# Patient Record
Sex: Female | Born: 1980 | Race: White | Hispanic: No | Marital: Married | Smoking: Never smoker
Health system: Southern US, Community
[De-identification: ages and names within clinical notes are randomized; demographics above are authoritative.]

## PROBLEM LIST (undated history)

## (undated) DIAGNOSIS — I499 Cardiac arrhythmia, unspecified: Secondary | ICD-10-CM

## (undated) HISTORY — PX: NO PAST SURGERIES: SHX2092

## (undated) HISTORY — DX: Cardiac arrhythmia, unspecified: I49.9

---

## 2012-01-24 ENCOUNTER — Other Ambulatory Visit (HOSPITAL_COMMUNITY): Payer: Self-pay | Admitting: Internal Medicine

## 2012-01-24 DIAGNOSIS — N926 Irregular menstruation, unspecified: Secondary | ICD-10-CM

## 2012-01-24 DIAGNOSIS — R109 Unspecified abdominal pain: Secondary | ICD-10-CM

## 2012-01-30 ENCOUNTER — Ambulatory Visit (HOSPITAL_COMMUNITY): Payer: BC Managed Care – PPO

## 2012-01-30 ENCOUNTER — Ambulatory Visit (HOSPITAL_COMMUNITY)
Admission: RE | Admit: 2012-01-30 | Discharge: 2012-01-30 | Disposition: A | Discharge status: Home / Self Care | Payer: BC Managed Care – PPO | Source: Ambulatory Visit | Attending: Internal Medicine | Admitting: Internal Medicine

## 2012-01-30 ENCOUNTER — Other Ambulatory Visit (HOSPITAL_COMMUNITY): Payer: Self-pay | Admitting: Internal Medicine

## 2012-01-30 DIAGNOSIS — R109 Unspecified abdominal pain: Secondary | ICD-10-CM

## 2012-01-30 DIAGNOSIS — N838 Other noninflammatory disorders of ovary, fallopian tube and broad ligament: Secondary | ICD-10-CM | POA: Insufficient documentation

## 2012-01-30 DIAGNOSIS — N926 Irregular menstruation, unspecified: Secondary | ICD-10-CM

## 2012-11-17 ENCOUNTER — Ambulatory Visit (HOSPITAL_COMMUNITY)
Admission: RE | Admit: 2012-11-17 | Discharge: 2012-11-17 | Disposition: A | Discharge status: Home / Self Care | Payer: BC Managed Care – PPO | Source: Ambulatory Visit | Attending: Internal Medicine | Admitting: Internal Medicine

## 2012-11-17 ENCOUNTER — Other Ambulatory Visit (HOSPITAL_COMMUNITY): Payer: Self-pay | Admitting: Internal Medicine

## 2012-11-17 DIAGNOSIS — M79609 Pain in unspecified limb: Secondary | ICD-10-CM | POA: Insufficient documentation

## 2012-11-17 DIAGNOSIS — M5030 Other cervical disc degeneration, unspecified cervical region: Secondary | ICD-10-CM | POA: Insufficient documentation

## 2012-11-17 DIAGNOSIS — M542 Cervicalgia: Secondary | ICD-10-CM

## 2012-11-25 ENCOUNTER — Other Ambulatory Visit: Payer: Self-pay | Admitting: Pain Medicine

## 2012-11-25 DIAGNOSIS — M542 Cervicalgia: Secondary | ICD-10-CM

## 2012-11-26 ENCOUNTER — Ambulatory Visit
Admission: RE | Admit: 2012-11-26 | Discharge: 2012-11-26 | Disposition: A | Discharge status: Home / Self Care | Payer: BC Managed Care – PPO | Source: Ambulatory Visit | Attending: Pain Medicine | Admitting: Pain Medicine

## 2012-11-26 DIAGNOSIS — M542 Cervicalgia: Secondary | ICD-10-CM

## 2012-11-27 ENCOUNTER — Other Ambulatory Visit: Payer: Self-pay | Admitting: Internal Medicine

## 2012-11-27 DIAGNOSIS — M542 Cervicalgia: Secondary | ICD-10-CM

## 2012-12-01 ENCOUNTER — Ambulatory Visit
Admission: RE | Admit: 2012-12-01 | Discharge: 2012-12-01 | Disposition: A | Discharge status: Home / Self Care | Payer: BC Managed Care – PPO | Source: Ambulatory Visit | Attending: Internal Medicine | Admitting: Internal Medicine

## 2012-12-01 DIAGNOSIS — M542 Cervicalgia: Secondary | ICD-10-CM

## 2012-12-01 MED ORDER — IOHEXOL 300 MG/ML  SOLN
1.0000 mL | Freq: Once | INTRAMUSCULAR | Status: AC | PRN
  Administered 2012-12-01: 1 mL via EPIDURAL

## 2012-12-01 MED ORDER — TRIAMCINOLONE ACETONIDE 40 MG/ML IJ SUSP (RADIOLOGY)
60.0000 mg | Freq: Once | INTRAMUSCULAR | Status: AC
  Administered 2012-12-01: 60 mg via EPIDURAL

## 2012-12-02 ENCOUNTER — Other Ambulatory Visit: Payer: Self-pay | Admitting: Internal Medicine

## 2012-12-02 DIAGNOSIS — M531 Cervicobrachial syndrome: Secondary | ICD-10-CM

## 2012-12-03 ENCOUNTER — Ambulatory Visit
Admission: RE | Admit: 2012-12-03 | Discharge: 2012-12-03 | Disposition: A | Discharge status: Home / Self Care | Payer: BC Managed Care – PPO | Source: Ambulatory Visit | Attending: Internal Medicine | Admitting: Internal Medicine

## 2012-12-03 DIAGNOSIS — M531 Cervicobrachial syndrome: Secondary | ICD-10-CM

## 2012-12-03 MED ORDER — GADOBENATE DIMEGLUMINE 529 MG/ML IV SOLN
14.0000 mL | Freq: Once | INTRAVENOUS | Status: AC | PRN
  Administered 2012-12-03: 14 mL via INTRAVENOUS

## 2012-12-04 ENCOUNTER — Other Ambulatory Visit: Payer: BC Managed Care – PPO

## 2013-02-25 ENCOUNTER — Ambulatory Visit (HOSPITAL_COMMUNITY)
Admission: RE | Admit: 2013-02-25 | Discharge: 2013-02-25 | Disposition: A | Discharge status: Home / Self Care | Payer: BC Managed Care – PPO | Source: Ambulatory Visit | Attending: Internal Medicine | Admitting: Internal Medicine

## 2013-02-25 ENCOUNTER — Other Ambulatory Visit (HOSPITAL_COMMUNITY): Payer: Self-pay | Admitting: Internal Medicine

## 2013-02-25 DIAGNOSIS — R059 Cough, unspecified: Secondary | ICD-10-CM | POA: Insufficient documentation

## 2013-02-25 DIAGNOSIS — R05 Cough: Secondary | ICD-10-CM

## 2013-02-25 DIAGNOSIS — R0602 Shortness of breath: Secondary | ICD-10-CM | POA: Insufficient documentation

## 2013-02-25 DIAGNOSIS — R0989 Other specified symptoms and signs involving the circulatory and respiratory systems: Secondary | ICD-10-CM | POA: Insufficient documentation

## 2013-08-21 ENCOUNTER — Other Ambulatory Visit: Payer: Self-pay | Admitting: Emergency Medicine

## 2013-08-21 MED ORDER — CYCLOBENZAPRINE HCL 10 MG PO TABS: 10.0000 mg | ORAL_TABLET | Freq: Three times a day (TID) | ORAL | Status: DC | PRN

## 2013-08-21 MED ORDER — ATENOLOL 50 MG PO TABS: 50.0000 mg | ORAL_TABLET | Freq: Every day | ORAL | Status: DC

## 2013-08-21 MED ORDER — TRAMADOL HCL 50 MG PO TABS: 50.0000 mg | ORAL_TABLET | Freq: Four times a day (QID) | ORAL | Status: AC | PRN

## 2013-08-21 MED ORDER — DESVENLAFAXINE SUCCINATE ER 50 MG PO TB24: 50.0000 mg | ORAL_TABLET | Freq: Every day | ORAL | Status: DC

## 2013-08-31 ENCOUNTER — Other Ambulatory Visit: Payer: Self-pay | Admitting: Internal Medicine

## 2013-08-31 MED ORDER — PROMETHAZINE HCL 25 MG PO TABS: 25.0000 mg | ORAL_TABLET | Freq: Four times a day (QID) | ORAL | Status: DC | PRN

## 2013-11-17 ENCOUNTER — Other Ambulatory Visit: Payer: Self-pay | Admitting: Internal Medicine

## 2013-11-17 ENCOUNTER — Other Ambulatory Visit: Payer: Self-pay

## 2013-11-17 DIAGNOSIS — N3000 Acute cystitis without hematuria: Secondary | ICD-10-CM

## 2013-11-17 MED ORDER — SULFAMETHOXAZOLE-TMP DS 800-160 MG PO TABS: 1.0000 | ORAL_TABLET | Freq: Two times a day (BID) | ORAL | Status: DC

## 2013-11-17 MED ORDER — PHENAZOPYRIDINE HCL 100 MG PO TABS: 100.0000 mg | ORAL_TABLET | Freq: Three times a day (TID) | ORAL | Status: DC | PRN

## 2013-11-17 MED ORDER — CIPROFLOXACIN HCL 500 MG PO TABS: 500.0000 mg | ORAL_TABLET | Freq: Two times a day (BID) | ORAL | Status: AC

## 2013-11-18 LAB — URINALYSIS, MICROSCOPIC ONLY
Bacteria, UA: NONE SEEN
CRYSTALS: NONE SEEN
Casts: NONE SEEN
SQUAMOUS EPITHELIAL / LPF: NONE SEEN

## 2013-11-18 LAB — URINE CULTURE
Colony Count: NO GROWTH
Organism ID, Bacteria: NO GROWTH

## 2013-12-08 ENCOUNTER — Other Ambulatory Visit: Payer: Self-pay | Admitting: Emergency Medicine

## 2013-12-08 ENCOUNTER — Other Ambulatory Visit: Payer: BC Managed Care – PPO

## 2013-12-08 DIAGNOSIS — N3000 Acute cystitis without hematuria: Secondary | ICD-10-CM

## 2013-12-08 DIAGNOSIS — N390 Urinary tract infection, site not specified: Secondary | ICD-10-CM

## 2013-12-09 LAB — URINALYSIS, ROUTINE W REFLEX MICROSCOPIC
BILIRUBIN URINE: NEGATIVE
GLUCOSE, UA: NEGATIVE mg/dL
Hgb urine dipstick: NEGATIVE
Ketones, ur: NEGATIVE mg/dL
Nitrite: POSITIVE — AB
PROTEIN: NEGATIVE mg/dL
SPECIFIC GRAVITY, URINE: 1.012 (ref 1.005–1.030)
UROBILINOGEN UA: 1 mg/dL (ref 0.0–1.0)
pH: 7.5 (ref 5.0–8.0)

## 2013-12-09 LAB — URINALYSIS, MICROSCOPIC ONLY
Bacteria, UA: NONE SEEN
CRYSTALS: NONE SEEN
Casts: NONE SEEN

## 2013-12-09 LAB — URINE CULTURE
COLONY COUNT: NO GROWTH
Organism ID, Bacteria: NO GROWTH

## 2014-01-04 ENCOUNTER — Other Ambulatory Visit: Payer: Self-pay | Admitting: Internal Medicine

## 2014-01-04 DIAGNOSIS — K589 Irritable bowel syndrome without diarrhea: Secondary | ICD-10-CM

## 2014-01-04 DIAGNOSIS — R002 Palpitations: Secondary | ICD-10-CM

## 2014-01-04 DIAGNOSIS — Z79899 Other long term (current) drug therapy: Secondary | ICD-10-CM

## 2014-01-04 DIAGNOSIS — E559 Vitamin D deficiency, unspecified: Secondary | ICD-10-CM

## 2014-01-04 MED ORDER — HYOSCYAMINE SULFATE 0.125 MG SL SUBL: SUBLINGUAL_TABLET | SUBLINGUAL | Status: DC

## 2014-01-04 MED ORDER — VITAMIN D3 125 MCG (5000 UT) PO CAPS: 5000.0000 [IU] | ORAL_CAPSULE | Freq: Every morning | ORAL | Status: DC

## 2014-01-18 ENCOUNTER — Other Ambulatory Visit: Payer: Self-pay | Admitting: Emergency Medicine

## 2014-01-18 MED ORDER — AZITHROMYCIN 250 MG PO TABS: ORAL_TABLET | ORAL | Status: AC

## 2014-01-25 ENCOUNTER — Other Ambulatory Visit (INDEPENDENT_AMBULATORY_CARE_PROVIDER_SITE_OTHER): Payer: BC Managed Care – PPO | Admitting: Internal Medicine

## 2014-01-25 DIAGNOSIS — M5430 Sciatica, unspecified side: Secondary | ICD-10-CM

## 2014-01-25 MED ORDER — DEXAMETHASONE SODIUM PHOSPHATE 100 MG/10ML IJ SOLN
10.0000 mg | Freq: Once | INTRAMUSCULAR | Status: AC
  Administered 2014-01-25: 10 mg via INTRAMUSCULAR

## 2014-01-25 MED ORDER — PREDNISONE 20 MG PO TABS: ORAL_TABLET | ORAL | Status: DC

## 2014-01-25 MED ORDER — ONDANSETRON HCL 8 MG PO TABS: ORAL_TABLET | ORAL | Status: DC

## 2014-01-27 ENCOUNTER — Other Ambulatory Visit: Payer: Self-pay | Admitting: Internal Medicine

## 2014-01-27 ENCOUNTER — Ambulatory Visit (HOSPITAL_COMMUNITY)
Admission: RE | Admit: 2014-01-27 | Discharge: 2014-01-27 | Disposition: A | Discharge status: Home / Self Care | Payer: BC Managed Care – PPO | Source: Ambulatory Visit | Attending: Internal Medicine | Admitting: Internal Medicine

## 2014-01-27 DIAGNOSIS — M25559 Pain in unspecified hip: Secondary | ICD-10-CM | POA: Insufficient documentation

## 2014-01-27 DIAGNOSIS — M545 Low back pain, unspecified: Secondary | ICD-10-CM

## 2014-01-27 DIAGNOSIS — M25551 Pain in right hip: Secondary | ICD-10-CM

## 2014-03-29 ENCOUNTER — Other Ambulatory Visit: Payer: Self-pay | Admitting: Internal Medicine

## 2014-03-29 ENCOUNTER — Other Ambulatory Visit: Payer: BC Managed Care – PPO

## 2014-03-29 DIAGNOSIS — Z79899 Other long term (current) drug therapy: Secondary | ICD-10-CM

## 2014-03-29 DIAGNOSIS — R109 Unspecified abdominal pain: Secondary | ICD-10-CM

## 2014-03-29 DIAGNOSIS — R5381 Other malaise: Secondary | ICD-10-CM

## 2014-03-29 DIAGNOSIS — E559 Vitamin D deficiency, unspecified: Secondary | ICD-10-CM

## 2014-03-29 DIAGNOSIS — I10 Essential (primary) hypertension: Secondary | ICD-10-CM

## 2014-03-29 DIAGNOSIS — R5383 Other fatigue: Secondary | ICD-10-CM

## 2014-03-29 DIAGNOSIS — R7309 Other abnormal glucose: Secondary | ICD-10-CM

## 2014-03-29 LAB — CBC WITH DIFFERENTIAL/PLATELET
Basophils Absolute: 0 10*3/uL (ref 0.0–0.1)
Basophils Relative: 0 % (ref 0–1)
EOS ABS: 0.1 10*3/uL (ref 0.0–0.7)
EOS PCT: 1 % (ref 0–5)
HCT: 39.4 % (ref 36.0–46.0)
Hemoglobin: 13.4 g/dL (ref 12.0–15.0)
Lymphocytes Relative: 38 % (ref 12–46)
Lymphs Abs: 2.9 10*3/uL (ref 0.7–4.0)
MCH: 29.8 pg (ref 26.0–34.0)
MCHC: 34 g/dL (ref 30.0–36.0)
MCV: 87.8 fL (ref 78.0–100.0)
Monocytes Absolute: 0.4 10*3/uL (ref 0.1–1.0)
Monocytes Relative: 5 % (ref 3–12)
NEUTROS PCT: 56 % (ref 43–77)
Neutro Abs: 4.3 10*3/uL (ref 1.7–7.7)
Platelets: 255 10*3/uL (ref 150–400)
RBC: 4.49 MIL/uL (ref 3.87–5.11)
RDW: 13.1 % (ref 11.5–15.5)
WBC: 7.7 10*3/uL (ref 4.0–10.5)

## 2014-03-30 ENCOUNTER — Encounter: Payer: Self-pay | Admitting: Emergency Medicine

## 2014-03-30 LAB — HEPATIC FUNCTION PANEL
ALBUMIN: 4.8 g/dL (ref 3.5–5.2)
ALT: 17 U/L (ref 0–35)
AST: 22 U/L (ref 0–37)
Alkaline Phosphatase: 33 U/L — ABNORMAL LOW (ref 39–117)
Bilirubin, Direct: 0.1 mg/dL (ref 0.0–0.3)
Indirect Bilirubin: 0.3 mg/dL (ref 0.2–1.2)
TOTAL PROTEIN: 6.9 g/dL (ref 6.0–8.3)
Total Bilirubin: 0.4 mg/dL (ref 0.2–1.2)

## 2014-03-30 LAB — URINE CULTURE
COLONY COUNT: NO GROWTH
Organism ID, Bacteria: NO GROWTH

## 2014-03-30 LAB — BASIC METABOLIC PANEL WITH GFR
BUN: 17 mg/dL (ref 6–23)
CALCIUM: 9.6 mg/dL (ref 8.4–10.5)
CO2: 25 mEq/L (ref 19–32)
CREATININE: 0.68 mg/dL (ref 0.50–1.10)
Chloride: 104 mEq/L (ref 96–112)
Glucose, Bld: 96 mg/dL (ref 70–99)
POTASSIUM: 4.1 meq/L (ref 3.5–5.3)
Sodium: 139 mEq/L (ref 135–145)

## 2014-03-30 LAB — URINALYSIS, MICROSCOPIC ONLY
Bacteria, UA: NONE SEEN
CASTS: NONE SEEN
SQUAMOUS EPITHELIAL / LPF: NONE SEEN

## 2014-03-30 LAB — IRON AND TIBC
%SAT: 37 % (ref 20–55)
Iron: 126 ug/dL (ref 42–145)
TIBC: 341 ug/dL (ref 250–470)
UIBC: 215 ug/dL (ref 125–400)

## 2014-03-30 LAB — VITAMIN D 25 HYDROXY (VIT D DEFICIENCY, FRACTURES): VIT D 25 HYDROXY: 48 ng/mL (ref 30–89)

## 2014-03-30 LAB — MAGNESIUM: MAGNESIUM: 1.8 mg/dL (ref 1.5–2.5)

## 2014-03-30 LAB — VITAMIN B12: VITAMIN B 12: 1576 pg/mL — AB (ref 211–911)

## 2014-03-30 LAB — INSULIN, FASTING: INSULIN FASTING, SERUM: 49 u[IU]/mL — AB (ref 3–28)

## 2014-03-30 LAB — HEMOGLOBIN A1C
Hgb A1c MFr Bld: 5.3 % (ref <5.7)
Mean Plasma Glucose: 105 mg/dL (ref <117)

## 2014-03-30 LAB — TSH: TSH: 1.215 u[IU]/mL (ref 0.350–4.500)

## 2014-04-22 ENCOUNTER — Other Ambulatory Visit: Payer: Self-pay | Admitting: Emergency Medicine

## 2014-04-22 DIAGNOSIS — K589 Irritable bowel syndrome without diarrhea: Secondary | ICD-10-CM

## 2014-04-22 MED ORDER — PROMETHAZINE HCL 25 MG PO TABS: 25.0000 mg | ORAL_TABLET | Freq: Four times a day (QID) | ORAL | Status: DC | PRN

## 2014-04-22 MED ORDER — FLUCONAZOLE 150 MG PO TABS: 150.0000 mg | ORAL_TABLET | ORAL | Status: DC

## 2014-04-22 MED ORDER — HYOSCYAMINE SULFATE 0.125 MG SL SUBL: SUBLINGUAL_TABLET | SUBLINGUAL | Status: DC

## 2014-04-22 MED ORDER — PREDNISONE 20 MG PO TABS: ORAL_TABLET | ORAL | Status: DC

## 2014-04-22 MED ORDER — METRONIDAZOLE 0.75 % VA GEL
1.0000 | Freq: Two times a day (BID) | VAGINAL | Status: DC
Start: 2014-04-22 — End: 2014-10-22

## 2014-06-13 IMAGING — CR DG HIP COMPLETE 2+V*R*
2 series · 2 of 2 positions shown · non-contrast
Comparison: None.

CLINICAL DATA: Right hip pain x1 month

EXAM:
RIGHT HIP - COMPLETE 2+ VIEW

[view not recorded (1 of 2)]
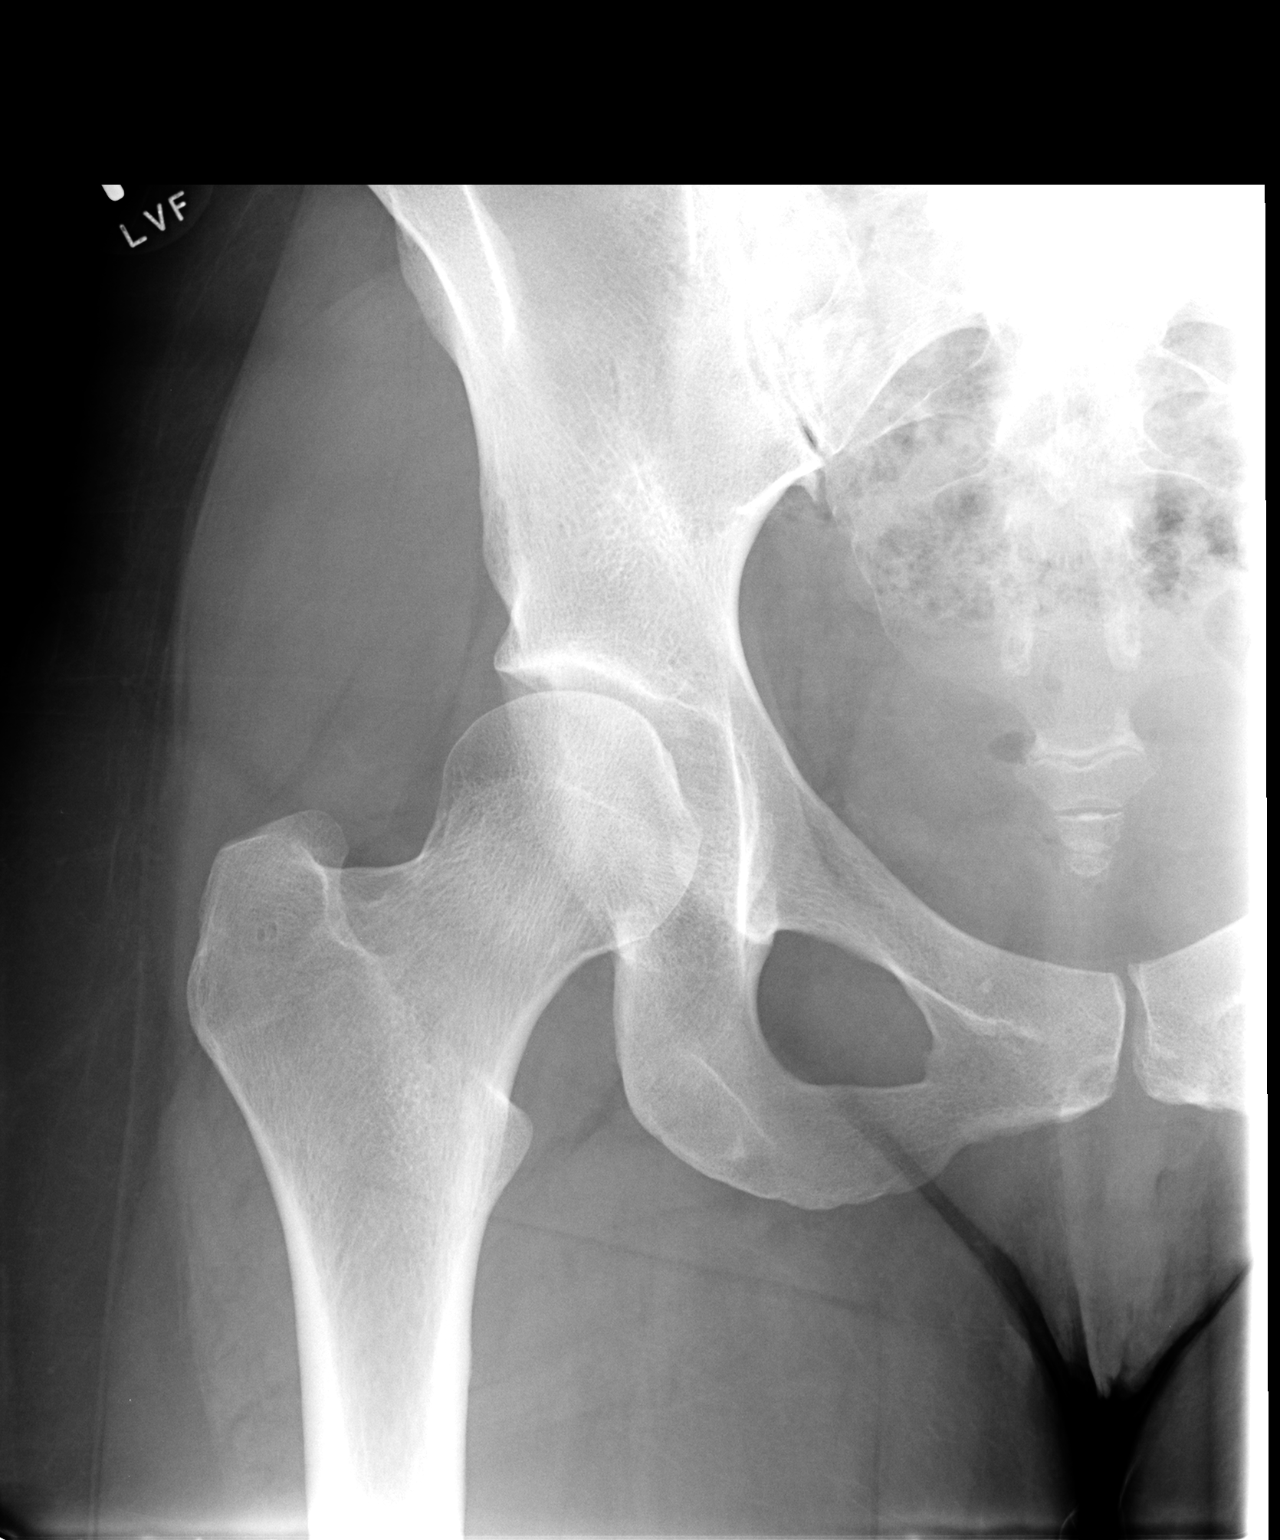

[view not recorded (2 of 2)]
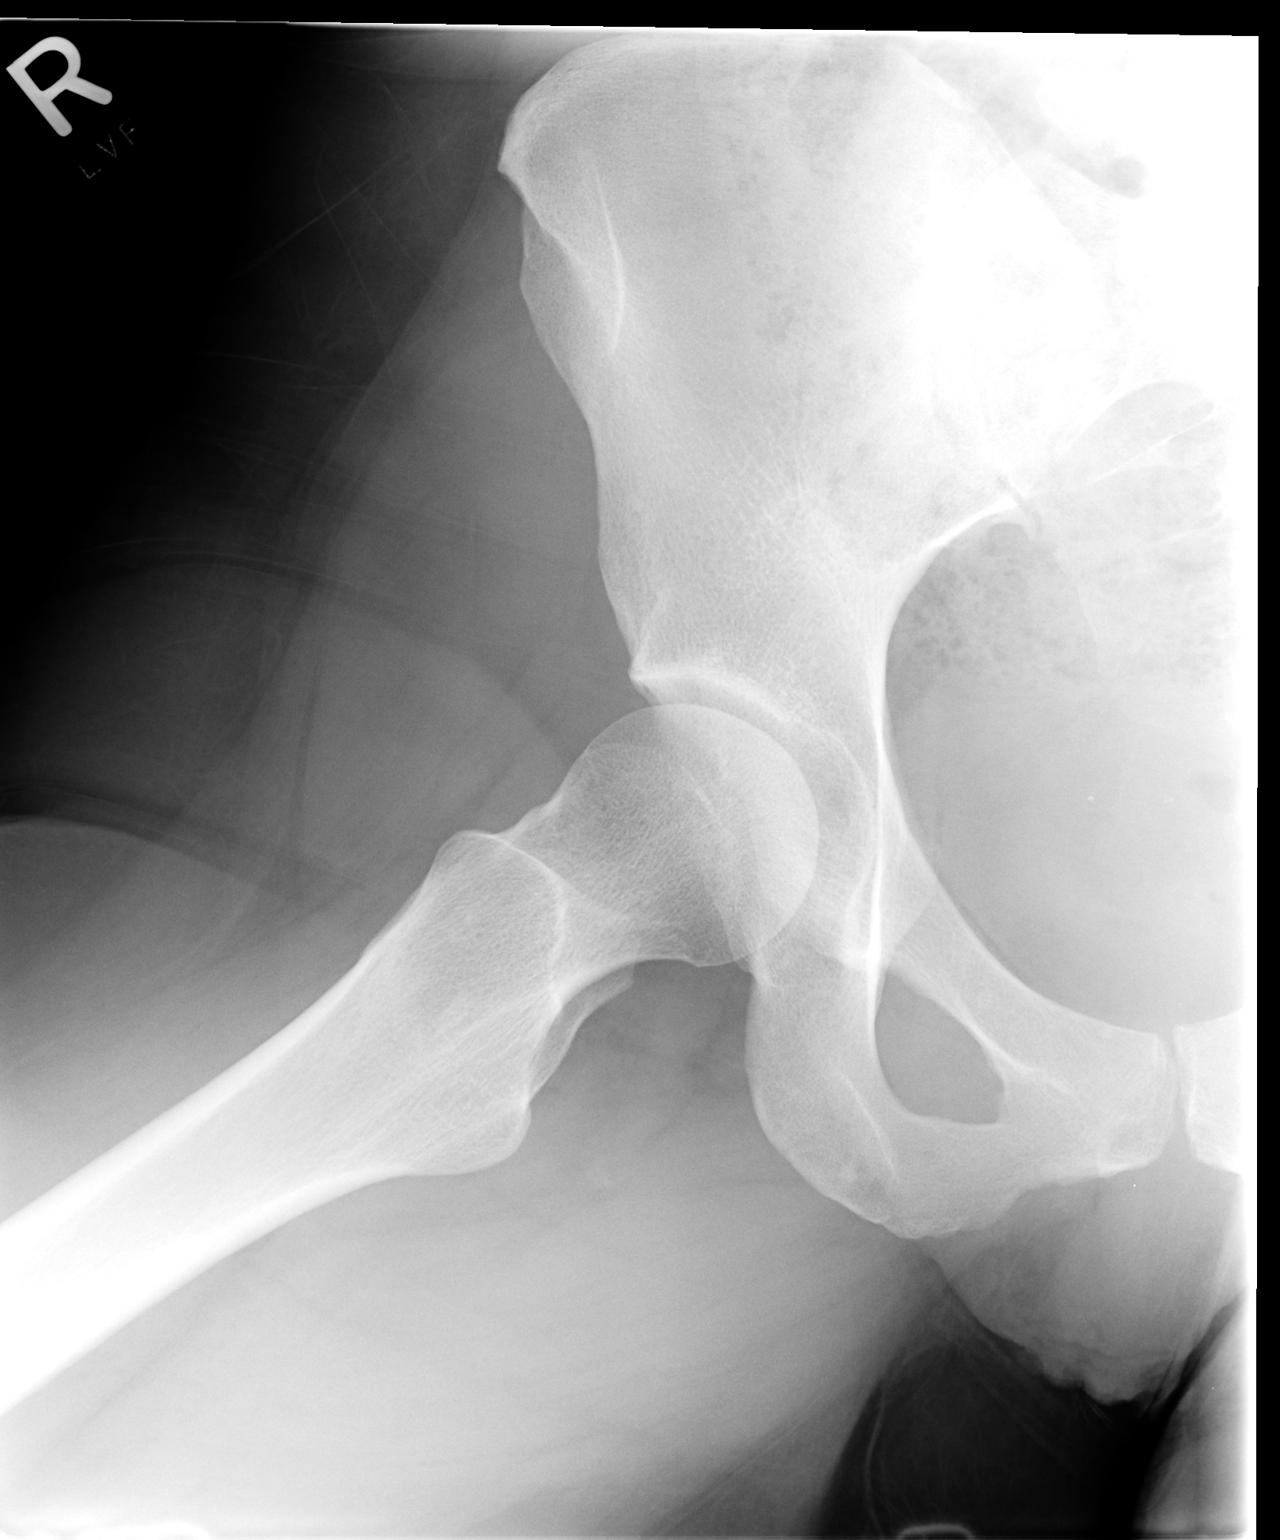

[2 of 2 positions shown; findings below may reference images not displayed]

FINDINGS: No fracture or dislocation is seen.

The joint spaces are preserved.

Visualized right pelvis appears intact.
IMPRESSION: No acute osseous abnormality is seen.

## 2014-06-18 ENCOUNTER — Other Ambulatory Visit: Payer: Self-pay | Admitting: Internal Medicine

## 2014-06-18 DIAGNOSIS — T7840XA Allergy, unspecified, initial encounter: Secondary | ICD-10-CM

## 2014-06-18 DIAGNOSIS — N390 Urinary tract infection, site not specified: Secondary | ICD-10-CM

## 2014-06-18 DIAGNOSIS — L0390 Cellulitis, unspecified: Secondary | ICD-10-CM

## 2014-06-18 DIAGNOSIS — R300 Dysuria: Secondary | ICD-10-CM

## 2014-06-18 DIAGNOSIS — F411 Generalized anxiety disorder: Secondary | ICD-10-CM

## 2014-06-18 DIAGNOSIS — M62838 Other muscle spasm: Secondary | ICD-10-CM

## 2014-06-18 DIAGNOSIS — J0410 Acute tracheitis without obstruction: Secondary | ICD-10-CM

## 2014-06-18 MED ORDER — AZITHROMYCIN 250 MG PO TABS: ORAL_TABLET | ORAL | Status: DC

## 2014-06-18 MED ORDER — PREDNISONE 20 MG PO TABS: ORAL_TABLET | ORAL | Status: DC

## 2014-06-18 MED ORDER — CYCLOBENZAPRINE HCL 10 MG PO TABS: 10.0000 mg | ORAL_TABLET | Freq: Three times a day (TID) | ORAL | Status: DC | PRN

## 2014-06-18 MED ORDER — DOXYCYCLINE HYCLATE 100 MG PO CAPS: ORAL_CAPSULE | ORAL | Status: DC

## 2014-06-18 MED ORDER — METOPROLOL TARTRATE 50 MG PO TABS: ORAL_TABLET | ORAL | Status: DC

## 2014-06-18 MED ORDER — PHENAZOPYRIDINE HCL 100 MG PO TABS: 100.0000 mg | ORAL_TABLET | Freq: Three times a day (TID) | ORAL | Status: DC | PRN

## 2014-06-18 MED ORDER — CIPROFLOXACIN HCL 500 MG PO TABS: ORAL_TABLET | ORAL | Status: DC

## 2014-07-25 ENCOUNTER — Other Ambulatory Visit: Payer: Self-pay | Admitting: Internal Medicine

## 2014-07-25 DIAGNOSIS — R112 Nausea with vomiting, unspecified: Secondary | ICD-10-CM

## 2014-07-25 MED ORDER — ONDANSETRON HCL 8 MG PO TABS: ORAL_TABLET | ORAL | Status: DC

## 2014-10-22 ENCOUNTER — Other Ambulatory Visit: Payer: Self-pay | Admitting: Internal Medicine

## 2014-10-22 MED ORDER — BUPROPION HCL ER (XL) 150 MG PO TB24: ORAL_TABLET | ORAL | Status: DC

## 2014-11-02 ENCOUNTER — Other Ambulatory Visit: Payer: Self-pay | Admitting: Internal Medicine

## 2014-11-02 DIAGNOSIS — F411 Generalized anxiety disorder: Secondary | ICD-10-CM

## 2014-11-02 MED ORDER — METOPROLOL TARTRATE 100 MG PO TABS: ORAL_TABLET | ORAL | Status: DC

## 2014-12-10 ENCOUNTER — Other Ambulatory Visit: Payer: Self-pay | Admitting: Internal Medicine

## 2014-12-10 DIAGNOSIS — D229 Melanocytic nevi, unspecified: Secondary | ICD-10-CM

## 2014-12-10 NOTE — Progress Notes (Signed)
  With informed consent & after aseptic prep and local anesthesia w/Marcaine 0.5%   1) A mulberry type medium/dark brown of the mid left flank was excised and sent for path. Wound was closed with a running suture with nylon 5-0.   2) 2 oblong flat lesions each ~ 2-3 x 6-8 mm were excised in a horizonal line at the mid left anterolateral left back & sent for path. Wound likewise closed with 5-0 nylon.  Both wounds were painted with "New Skin" and then 2 x 3 " tegoderm applied.   DDx: Nevi, r/o atypia

## 2014-12-20 ENCOUNTER — Other Ambulatory Visit: Payer: Self-pay | Admitting: Internal Medicine

## 2014-12-20 DIAGNOSIS — B372 Candidiasis of skin and nail: Secondary | ICD-10-CM

## 2014-12-20 DIAGNOSIS — J0410 Acute tracheitis without obstruction: Secondary | ICD-10-CM

## 2014-12-20 DIAGNOSIS — L22 Diaper dermatitis: Principal | ICD-10-CM

## 2014-12-20 DIAGNOSIS — T753XXA Motion sickness, initial encounter: Secondary | ICD-10-CM

## 2014-12-20 MED ORDER — MECLIZINE HCL 25 MG PO TABS
25.0000 mg | ORAL_TABLET | Freq: Three times a day (TID) | ORAL | Status: DC | PRN
Start: 2014-12-20 — End: 2015-09-19

## 2014-12-20 MED ORDER — FLUCONAZOLE 150 MG PO TABS: 150.0000 mg | ORAL_TABLET | Freq: Every day | ORAL | Status: DC

## 2014-12-20 MED ORDER — AZITHROMYCIN 250 MG PO TABS: ORAL_TABLET | ORAL | Status: AC

## 2014-12-20 MED ORDER — SCOPOLAMINE 1 MG/3DAYS TD PT72: 1.0000 | MEDICATED_PATCH | TRANSDERMAL | Status: DC

## 2014-12-22 ENCOUNTER — Ambulatory Visit (INDEPENDENT_AMBULATORY_CARE_PROVIDER_SITE_OTHER): Payer: 59 | Admitting: Internal Medicine

## 2014-12-22 ENCOUNTER — Encounter: Payer: Self-pay | Admitting: Internal Medicine

## 2014-12-22 ENCOUNTER — Encounter (INDEPENDENT_AMBULATORY_CARE_PROVIDER_SITE_OTHER): Payer: Self-pay

## 2014-12-22 VITALS — BP 126/80 | HR 72 | Temp 98.6°F | Resp 18 | Ht 66.25 in | Wt 157.0 lb

## 2014-12-22 DIAGNOSIS — M79604 Pain in right leg: Secondary | ICD-10-CM

## 2014-12-22 DIAGNOSIS — M79605 Pain in left leg: Secondary | ICD-10-CM

## 2014-12-22 NOTE — Progress Notes (Signed)
   Subjective:    Patient ID: Pamela Walton, female    DOB: [birth year], 34 y.o.   MRN: [id number]  HPI  Patient reports to the office for bilateral leg pain which has been intermittent for the past 2 weeks.  She reports that they are achey and have gotten worse.  She has been massaging, using a foam rollers, ibuprofen, and increased magnesium with no relief.  Only new medication within the past couple months is the birth control. She reports no changes in diet.  No injuries to the legs prior.  She does take a baby aspirin daily.    Review of Systems  Constitutional: Positive for fatigue. Negative for fever and chills.  HENT: Positive for hearing loss.   Eyes: Negative.   Respiratory: Negative for chest tightness and shortness of breath.   Cardiovascular: Negative for chest pain, palpitations and leg swelling.  Gastrointestinal: Positive for nausea. Negative for vomiting, abdominal pain, diarrhea and constipation.  Genitourinary: Negative for dysuria, urgency, frequency, hematuria, vaginal bleeding, vaginal discharge, difficulty urinating and vaginal pain.  Musculoskeletal: Positive for myalgias. Negative for back pain, joint swelling and gait problem.  Neurological: Positive for headaches. Negative for dizziness, syncope and light-headedness.       Objective:   Physical Exam  Constitutional: She is oriented to person, place, and time. She appears well-developed and well-nourished. No distress.  HENT:  Head: Normocephalic and atraumatic.  Mouth/Throat: Oropharynx is clear and moist. No oropharyngeal exudate.  Eyes: Conjunctivae and EOM are normal. Pupils are equal, round, and reactive to light. No scleral icterus.  Neck: Normal range of motion. Neck supple. No JVD present. No thyromegaly present.  Cardiovascular: Normal rate, regular rhythm, normal heart sounds and intact distal pulses.  Exam reveals no gallop and no friction rub.   No murmur heard. Pulmonary/Chest: Effort normal and  breath sounds normal. No respiratory distress. She has no wheezes. She has no rales. She exhibits no tenderness.  Abdominal: Soft. Bowel sounds are normal. She exhibits no distension and no mass. There is no tenderness. There is no rebound and no guarding.  Musculoskeletal:       Right knee: Normal.       Left knee: Normal.       Right ankle: Normal.       Left ankle: Normal.       Right upper leg: She exhibits no tenderness, no bony tenderness, no swelling, no edema, no deformity and no laceration.       Left upper leg: She exhibits no tenderness, no bony tenderness, no swelling, no edema, no deformity and no laceration.       Right lower leg: Normal.       Left lower leg: Normal.  Lymphadenopathy:    She has no cervical adenopathy.  Neurological: She is alert and oriented to person, place, and time.  Skin: Skin is warm and dry. She is not diaphoretic.  Psychiatric: She has a normal mood and affect. Her behavior is normal. Judgment and thought content normal.  Nursing note and vitals reviewed.      Assessment & Plan:    1. Bilateral leg pain Questionably related to new estrogen use.  Will investigate any possible cause outside this.  Will wait for results before trying meds or changing meds.    - CBC with Differential/Platelet - Comprehensive metabolic panel - Ferritin - Iron and TIBC - Magnesium - TSH - CK

## 2014-12-23 LAB — CBC WITH DIFFERENTIAL/PLATELET
Basophils Absolute: 0 10*3/uL (ref 0.0–0.1)
Basophils Relative: 0 % (ref 0–1)
EOS ABS: 0.1 10*3/uL (ref 0.0–0.7)
EOS PCT: 1 % (ref 0–5)
HCT: 40.5 % (ref 36.0–46.0)
HEMOGLOBIN: 13.5 g/dL (ref 12.0–15.0)
LYMPHS ABS: 2.6 10*3/uL (ref 0.7–4.0)
LYMPHS PCT: 34 % (ref 12–46)
MCH: 29.9 pg (ref 26.0–34.0)
MCHC: 33.3 g/dL (ref 30.0–36.0)
MCV: 89.6 fL (ref 78.0–100.0)
MONOS PCT: 6 % (ref 3–12)
MPV: 10.6 fL (ref 8.6–12.4)
Monocytes Absolute: 0.5 10*3/uL (ref 0.1–1.0)
NEUTROS ABS: 4.5 10*3/uL (ref 1.7–7.7)
NEUTROS PCT: 59 % (ref 43–77)
Platelets: 279 10*3/uL (ref 150–400)
RBC: 4.52 MIL/uL (ref 3.87–5.11)
RDW: 13.6 % (ref 11.5–15.5)
WBC: 7.6 10*3/uL (ref 4.0–10.5)

## 2014-12-23 LAB — COMPREHENSIVE METABOLIC PANEL
ALT: 16 U/L (ref 0–35)
AST: 21 U/L (ref 0–37)
Albumin: 4.3 g/dL (ref 3.5–5.2)
Alkaline Phosphatase: 26 U/L — ABNORMAL LOW (ref 39–117)
BUN: 15 mg/dL (ref 6–23)
CO2: 29 mEq/L (ref 19–32)
CREATININE: 0.82 mg/dL (ref 0.50–1.10)
Calcium: 9.2 mg/dL (ref 8.4–10.5)
Chloride: 103 mEq/L (ref 96–112)
Glucose, Bld: 83 mg/dL (ref 70–99)
Potassium: 4.2 mEq/L (ref 3.5–5.3)
Sodium: 139 mEq/L (ref 135–145)
Total Bilirubin: 0.4 mg/dL (ref 0.2–1.2)
Total Protein: 6.9 g/dL (ref 6.0–8.3)

## 2014-12-23 LAB — IRON AND TIBC
%SAT: 23 % (ref 20–55)
Iron: 103 ug/dL (ref 42–145)
TIBC: 447 ug/dL (ref 250–470)
UIBC: 344 ug/dL (ref 125–400)

## 2014-12-23 LAB — TSH: TSH: 1.146 u[IU]/mL (ref 0.350–4.500)

## 2014-12-23 LAB — CK: Total CK: 52 U/L (ref 7–177)

## 2014-12-23 LAB — FERRITIN: FERRITIN: 40 ng/mL (ref 10–291)

## 2014-12-23 LAB — MAGNESIUM: Magnesium: 1.9 mg/dL (ref 1.5–2.5)

## 2015-01-04 ENCOUNTER — Telehealth: Payer: Self-pay | Admitting: Internal Medicine

## 2015-01-05 NOTE — Telephone Encounter (Signed)
err

## 2015-03-28 ENCOUNTER — Other Ambulatory Visit: Payer: Self-pay | Admitting: Internal Medicine

## 2015-03-28 DIAGNOSIS — Z889 Allergy status to unspecified drugs, medicaments and biological substances status: Secondary | ICD-10-CM

## 2015-03-28 DIAGNOSIS — K589 Irritable bowel syndrome without diarrhea: Secondary | ICD-10-CM

## 2015-03-28 DIAGNOSIS — M62838 Other muscle spasm: Secondary | ICD-10-CM

## 2015-03-28 MED ORDER — HYOSCYAMINE SULFATE 0.125 MG SL SUBL: SUBLINGUAL_TABLET | SUBLINGUAL | Status: DC

## 2015-03-28 MED ORDER — PREDNISONE 20 MG PO TABS: ORAL_TABLET | ORAL | Status: DC

## 2015-03-28 MED ORDER — CYCLOBENZAPRINE HCL 10 MG PO TABS: 10.0000 mg | ORAL_TABLET | Freq: Three times a day (TID) | ORAL | Status: DC | PRN

## 2015-04-28 ENCOUNTER — Other Ambulatory Visit: Payer: Self-pay | Admitting: Internal Medicine

## 2015-04-28 ENCOUNTER — Encounter (INDEPENDENT_AMBULATORY_CARE_PROVIDER_SITE_OTHER): Payer: Self-pay

## 2015-04-28 ENCOUNTER — Other Ambulatory Visit: Payer: 59

## 2015-04-28 DIAGNOSIS — R300 Dysuria: Secondary | ICD-10-CM

## 2015-04-28 DIAGNOSIS — L22 Diaper dermatitis: Secondary | ICD-10-CM

## 2015-04-28 DIAGNOSIS — B372 Candidiasis of skin and nail: Secondary | ICD-10-CM

## 2015-04-28 MED ORDER — PHENAZOPYRIDINE HCL 100 MG PO TABS: ORAL_TABLET | ORAL | Status: DC

## 2015-04-28 MED ORDER — AMOXICILLIN-POT CLAVULANATE 875-125 MG PO TABS: ORAL_TABLET | ORAL | Status: AC

## 2015-04-28 MED ORDER — FLUCONAZOLE 150 MG PO TABS: ORAL_TABLET | ORAL | Status: DC

## 2015-04-29 LAB — URINALYSIS, ROUTINE W REFLEX MICROSCOPIC
Bilirubin Urine: NEGATIVE
Glucose, UA: NEGATIVE
Hgb urine dipstick: NEGATIVE
Ketones, ur: NEGATIVE
LEUKOCYTES UA: NEGATIVE
NITRITE: NEGATIVE
PROTEIN: NEGATIVE
Specific Gravity, Urine: 1.004 (ref 1.001–1.035)
pH: 7.5 (ref 5.0–8.0)

## 2015-04-29 LAB — URINE CULTURE
COLONY COUNT: NO GROWTH
ORGANISM ID, BACTERIA: NO GROWTH

## 2015-08-12 ENCOUNTER — Other Ambulatory Visit: Payer: Self-pay | Admitting: Internal Medicine

## 2015-08-12 MED ORDER — AZITHROMYCIN 250 MG PO TABS: ORAL_TABLET | ORAL | Status: DC

## 2015-08-12 MED ORDER — PREDNISONE 20 MG PO TABS: ORAL_TABLET | ORAL | Status: DC

## 2015-08-12 MED ORDER — CYCLOBENZAPRINE HCL 10 MG PO TABS: ORAL_TABLET | ORAL | Status: DC

## 2015-09-19 ENCOUNTER — Other Ambulatory Visit: Payer: Self-pay | Admitting: Internal Medicine

## 2015-09-19 DIAGNOSIS — R112 Nausea with vomiting, unspecified: Secondary | ICD-10-CM

## 2015-09-19 DIAGNOSIS — B379 Candidiasis, unspecified: Secondary | ICD-10-CM

## 2015-09-19 MED ORDER — PROMETHAZINE HCL 25 MG PO TABS: ORAL_TABLET | ORAL | Status: DC

## 2015-09-19 MED ORDER — FLUCONAZOLE 150 MG PO TABS: ORAL_TABLET | ORAL | Status: DC

## 2015-09-19 MED ORDER — ONDANSETRON HCL 8 MG PO TABS: ORAL_TABLET | ORAL | Status: DC

## 2015-12-12 ENCOUNTER — Other Ambulatory Visit: Payer: Self-pay | Admitting: Internal Medicine

## 2015-12-12 DIAGNOSIS — E069 Thyroiditis, unspecified: Secondary | ICD-10-CM

## 2015-12-12 DIAGNOSIS — E8881 Metabolic syndrome: Secondary | ICD-10-CM | POA: Insufficient documentation

## 2015-12-12 DIAGNOSIS — Z13220 Encounter for screening for lipoid disorders: Secondary | ICD-10-CM | POA: Insufficient documentation

## 2015-12-12 DIAGNOSIS — Z79899 Other long term (current) drug therapy: Secondary | ICD-10-CM

## 2015-12-12 DIAGNOSIS — E559 Vitamin D deficiency, unspecified: Secondary | ICD-10-CM

## 2015-12-13 ENCOUNTER — Other Ambulatory Visit: Payer: 59

## 2015-12-13 DIAGNOSIS — Z79899 Other long term (current) drug therapy: Secondary | ICD-10-CM

## 2015-12-13 DIAGNOSIS — E069 Thyroiditis, unspecified: Secondary | ICD-10-CM

## 2015-12-13 DIAGNOSIS — E559 Vitamin D deficiency, unspecified: Secondary | ICD-10-CM

## 2015-12-13 DIAGNOSIS — E8881 Metabolic syndrome: Secondary | ICD-10-CM

## 2015-12-13 LAB — CBC WITH DIFFERENTIAL/PLATELET
BASOS PCT: 1 %
Basophils Absolute: 66 cells/uL (ref 0–200)
Eosinophils Absolute: 66 cells/uL (ref 15–500)
Eosinophils Relative: 1 %
HEMATOCRIT: 39.7 % (ref 35.0–45.0)
Hemoglobin: 13 g/dL (ref 11.7–15.5)
LYMPHS PCT: 33 %
Lymphs Abs: 2178 cells/uL (ref 850–3900)
MCH: 29.3 pg (ref 27.0–33.0)
MCHC: 32.7 g/dL (ref 32.0–36.0)
MCV: 89.6 fL (ref 80.0–100.0)
MONO ABS: 396 {cells}/uL (ref 200–950)
MPV: 9.8 fL (ref 7.5–12.5)
Monocytes Relative: 6 %
NEUTROS ABS: 3894 {cells}/uL (ref 1500–7800)
Neutrophils Relative %: 59 %
PLATELETS: 266 10*3/uL (ref 140–400)
RBC: 4.43 MIL/uL (ref 3.80–5.10)
RDW: 12.6 % (ref 11.0–15.0)
WBC: 6.6 10*3/uL (ref 3.8–10.8)

## 2015-12-13 LAB — C-REACTIVE PROTEIN

## 2015-12-13 LAB — HEMOGLOBIN A1C
Hgb A1c MFr Bld: 5.1 % (ref <5.7)
Mean Plasma Glucose: 100 mg/dL

## 2015-12-13 LAB — T3, FREE: T3 FREE: 3.2 pg/mL (ref 2.3–4.2)

## 2015-12-13 LAB — TSH: TSH: 1 m[IU]/L

## 2015-12-13 LAB — T4, FREE: Free T4: 1.3 ng/dL (ref 0.8–1.8)

## 2015-12-14 LAB — THYROID ANTIBODIES
Thyroglobulin Ab: <1 IU/mL (ref <2)
Thyroperoxidase Ab SerPl-aCnc: 1 IU/mL (ref <9)

## 2015-12-14 LAB — VITAMIN D 25 HYDROXY (VIT D DEFICIENCY, FRACTURES): Vit D, 25-Hydroxy: 39 ng/mL (ref 30–100)

## 2015-12-14 LAB — SEDIMENTATION RATE: SED RATE: 4 mm/h (ref 0–20)

## 2015-12-14 LAB — INSULIN, RANDOM: Insulin: 17.3 u[IU]/mL (ref 2.0–19.6)

## 2015-12-26 ENCOUNTER — Other Ambulatory Visit: Payer: Self-pay | Admitting: Internal Medicine

## 2016-01-09 ENCOUNTER — Other Ambulatory Visit: Payer: Self-pay | Admitting: Internal Medicine

## 2016-01-10 ENCOUNTER — Other Ambulatory Visit: Payer: Self-pay | Admitting: Internal Medicine

## 2016-01-10 DIAGNOSIS — R002 Palpitations: Secondary | ICD-10-CM

## 2016-01-10 MED ORDER — METOPROLOL TARTRATE 100 MG PO TABS: ORAL_TABLET | ORAL | Status: DC

## 2016-04-19 ENCOUNTER — Other Ambulatory Visit: Payer: Self-pay | Admitting: Internal Medicine

## 2016-04-19 MED ORDER — BUPROPION HCL ER (XL) 150 MG PO TB24: ORAL_TABLET | ORAL | 1 refills | Status: DC

## 2016-04-19 MED ORDER — CYCLOBENZAPRINE HCL 10 MG PO TABS: ORAL_TABLET | ORAL | 1 refills | Status: DC

## 2016-05-18 ENCOUNTER — Other Ambulatory Visit: Payer: Self-pay | Admitting: Internal Medicine

## 2016-05-18 MED ORDER — PHENAZOPYRIDINE HCL 100 MG PO TABS: ORAL_TABLET | ORAL | 1 refills | Status: DC

## 2016-05-18 MED ORDER — PREDNISONE 20 MG PO TABS
ORAL_TABLET | ORAL | 1 refills | Status: DC
Start: 2016-05-18 — End: 2016-07-19

## 2016-07-19 ENCOUNTER — Other Ambulatory Visit: Payer: Self-pay | Admitting: Internal Medicine

## 2016-07-19 DIAGNOSIS — B372 Candidiasis of skin and nail: Secondary | ICD-10-CM

## 2016-07-19 MED ORDER — FLUCONAZOLE 150 MG PO TABS: ORAL_TABLET | ORAL | 1 refills | Status: DC

## 2016-10-11 ENCOUNTER — Ambulatory Visit (INDEPENDENT_AMBULATORY_CARE_PROVIDER_SITE_OTHER): Payer: 59 | Admitting: Internal Medicine

## 2016-10-11 DIAGNOSIS — M5412 Radiculopathy, cervical region: Secondary | ICD-10-CM | POA: Diagnosis not present

## 2016-10-11 DIAGNOSIS — M542 Cervicalgia: Secondary | ICD-10-CM

## 2016-10-11 MED ORDER — PREDNISONE 20 MG PO TABS: ORAL_TABLET | ORAL | 0 refills | Status: DC

## 2016-10-11 MED ORDER — OMEPRAZOLE 40 MG PO CPDR: 40.0000 mg | DELAYED_RELEASE_CAPSULE | Freq: Every day | ORAL | 1 refills | Status: DC

## 2016-10-11 NOTE — Progress Notes (Signed)
Assessment and Plan:   1. Neck pain  - Ambulatory referral to Physical Therapy - predniSONE (DELTASONE) 20 MG tablet; 3 tabs po daily x 3 days, then 2 tabs x 3 days, then 1.5 tabs x 3 days, then 1 tab x 3 days, then 0.5 tabs x 3 days  Dispense: 27 tablet; Refill: 0 - omeprazole (PRILOSEC) 40 MG capsule; Take 1 capsule (40 mg total) by mouth daily.  Dispense: 30 capsule; Refill: 1  2. Cervical radiculopathy at C6  - predniSONE (DELTASONE) 20 MG tablet; 3 tabs po daily x 3 days, then 2 tabs x 3 days, then 1.5 tabs x 3 days, then 1 tab x 3 days, then 0.5 tabs x 3 days  Dispense: 27 tablet; Refill: 0    HPI 36 y.o.female presents for evaluation of neck pain which has been steadily worsening for the last 3 months.  She takes flexeril at night to help with the pain.  She is doing stretching, tens unit, massages, and does some exercises for it at home but with no relief.  Pain is in the midline and she has bilateral trapezius pain.  She reports that she has bilateral radicular symptoms, and has some burning and tingling in her hands.  She does occasionally take the lyrica at home.  She is frustrated most by this.  She has not done dry needling in the past. She reports that the pain is aching in nature and .  Patient has a history of chronic neck pain in the past.  She has had relief with trigger points.  She has not done an oral taper of prednisone for a while.    No past medical history on file.   No Known Allergies    Current Outpatient Prescriptions on File Prior to Visit  Medication Sig Dispense Refill  . buPROPion (WELLBUTRIN XL) 150 MG 24 hr tablet Take 1 tablet 2 x/day for ADD 180 tablet 1  . Cholecalciferol (VITAMIN D3) 5000 UNITS CAPS Take 5,000 Units by mouth every morning.    . cyclobenzaprine (FLEXERIL) 10 MG tablet Take 1/2 to 1 tablet 3 x day as needed for muscle spasm 90 tablet 1  . fluconazole (DIFLUCAN) 150 MG tablet Take 1 tablet 3 x / week 12 tablet 1  . hyoscyamine  (LEVSIN/SL) 0.125 MG SL tablet Dissolve 1 to 2 tablets under the tongue every 4 hours as needed for nausea, cramping , bloating or diarrhea 100 tablet 1  . Magnesium 500 MG TABS Take by mouth daily.    . metoprolol (LOPRESSOR) 100 MG tablet TAKE ONE TABLET BY MOUTH TWICE DAILY AS DIRECTED 180 tablet 1  . ondansetron (ZOFRAN) 8 MG tablet As directed for nausea 60 tablet 99  . phenazopyridine (PYRIDIUM) 100 MG tablet Take 1 tablet 3 x/day as directed 60 tablet 1  . promethazine (PHENERGAN) 25 MG tablet Take as directed for Nausea 30 tablet 3   No current facility-administered medications on file prior to visit.     ROS: all negative except above.   Physical Exam: There were no vitals filed for this visit. There were no vitals taken for this visit. General Appearance: Well developed well nourished, non-toxic appearing in no apparent distress. Eyes: PERRLA, EOMs, conjunctiva w/ no swelling or erythema or discharge Sinuses: No Frontal/maxillary tenderness ENT/Mouth: Ear canals clear without swelling or erythema.  TM's normal bilaterally with no retractions, bulging, or loss of landmarks.   Neck: Supple, thyroid normal, no notable JVD  Respiratory: Respiratory effort normal, Clear  breath sounds anteriorly and posteriorly bilaterally without rales, rhonchi, wheezing or stridor. No retractions or accessory muscle usage. Cardio: RRR with no MRGs.   Abdomen: Soft, + BS.  Non tender, no guarding, rebound, hernias, masses.  Musculoskeletal: Patient rises slowly from sitting to standing.  They walk without an antalgic gait.  There is no evidence of erythema, ecchymosis, or gross deformity.  There is tenderness to palpation over inferior bony cervical spine, bilateral trapezius, and subscapular areas.  Active ROM is full but painful with extension.  Sensation to light touch is intact over all extremities, but mildly decreased in C6 dermatome on left hand.  Strength is symmetric and equal in all extremities.       Full ROM, 5/5 strength, normal gait.  Skin: Warm, dry without rashes  Neuro: Awake and oriented X 3, Cranial nerves intact. Normal muscle tone, no cerebellar symptoms. Sensation intact.  Psych: normal affect, Insight and Judgment appropriate.     Starlyn Skeans, PA-C 4:30 PM [facility]

## 2016-10-29 ENCOUNTER — Other Ambulatory Visit: Payer: Self-pay | Admitting: Internal Medicine

## 2016-10-29 MED ORDER — PROMETHAZINE HCL 25 MG PO TABS: ORAL_TABLET | ORAL | 3 refills | Status: DC

## 2016-10-29 MED ORDER — CHLORPHEN-PE-ACETAMINOPHEN 4-10-325 MG PO TABS: ORAL_TABLET | ORAL | 11 refills | Status: DC

## 2016-10-31 ENCOUNTER — Other Ambulatory Visit: Payer: Self-pay | Admitting: Internal Medicine

## 2016-10-31 MED ORDER — BENZONATATE 100 MG PO CAPS: 100.0000 mg | ORAL_CAPSULE | Freq: Four times a day (QID) | ORAL | 1 refills | Status: DC | PRN

## 2016-11-14 ENCOUNTER — Other Ambulatory Visit: Payer: Self-pay | Admitting: Internal Medicine

## 2016-11-14 MED ORDER — CYCLOBENZAPRINE HCL 10 MG PO TABS: ORAL_TABLET | ORAL | 1 refills | Status: DC

## 2016-11-15 ENCOUNTER — Other Ambulatory Visit: Payer: Self-pay | Admitting: Internal Medicine

## 2016-11-15 DIAGNOSIS — B999 Unspecified infectious disease: Secondary | ICD-10-CM

## 2016-11-15 MED ORDER — AZITHROMYCIN 250 MG PO TABS: ORAL_TABLET | ORAL | 1 refills | Status: DC

## 2016-11-15 MED ORDER — CIPROFLOXACIN HCL 250 MG PO TABS: ORAL_TABLET | ORAL | 1 refills | Status: DC

## 2016-11-15 MED ORDER — DOXYCYCLINE HYCLATE 100 MG PO CAPS: ORAL_CAPSULE | ORAL | 1 refills | Status: AC

## 2017-01-04 ENCOUNTER — Other Ambulatory Visit: Payer: Self-pay | Admitting: Internal Medicine

## 2017-01-04 DIAGNOSIS — M542 Cervicalgia: Secondary | ICD-10-CM

## 2017-01-04 DIAGNOSIS — M5412 Radiculopathy, cervical region: Secondary | ICD-10-CM

## 2017-01-04 MED ORDER — PHENTERMINE HCL 37.5 MG PO TABS: ORAL_TABLET | ORAL | 2 refills | Status: DC

## 2017-01-04 MED ORDER — PREDNISONE 20 MG PO TABS: ORAL_TABLET | ORAL | 0 refills | Status: AC

## 2017-01-04 MED ORDER — ALPRAZOLAM 1 MG PO TABS: 1.0000 mg | ORAL_TABLET | Freq: Every evening | ORAL | 0 refills | Status: DC | PRN

## 2017-01-07 ENCOUNTER — Other Ambulatory Visit: Payer: Self-pay | Admitting: Internal Medicine

## 2017-01-07 MED ORDER — VERAPAMIL HCL 80 MG PO TABS: ORAL_TABLET | ORAL | 0 refills | Status: DC

## 2017-02-04 ENCOUNTER — Other Ambulatory Visit: Payer: Self-pay | Admitting: Internal Medicine

## 2017-02-25 ENCOUNTER — Other Ambulatory Visit: Payer: Self-pay | Admitting: Internal Medicine

## 2017-02-25 DIAGNOSIS — R002 Palpitations: Secondary | ICD-10-CM

## 2017-02-25 MED ORDER — LABETALOL HCL 100 MG PO TABS: 100.0000 mg | ORAL_TABLET | Freq: Two times a day (BID) | ORAL | 5 refills | Status: DC

## 2017-02-25 MED ORDER — METOPROLOL TARTRATE 100 MG PO TABS: ORAL_TABLET | ORAL | 5 refills | Status: DC

## 2017-03-02 ENCOUNTER — Other Ambulatory Visit: Payer: Self-pay | Admitting: Internal Medicine

## 2017-03-14 ENCOUNTER — Other Ambulatory Visit: Payer: Self-pay | Admitting: Internal Medicine

## 2017-03-14 DIAGNOSIS — Z79899 Other long term (current) drug therapy: Secondary | ICD-10-CM

## 2017-03-14 DIAGNOSIS — E559 Vitamin D deficiency, unspecified: Secondary | ICD-10-CM

## 2017-03-14 DIAGNOSIS — Z13220 Encounter for screening for lipoid disorders: Secondary | ICD-10-CM

## 2017-03-14 DIAGNOSIS — E8881 Metabolic syndrome: Secondary | ICD-10-CM

## 2017-03-14 DIAGNOSIS — R5383 Other fatigue: Secondary | ICD-10-CM | POA: Insufficient documentation

## 2017-03-19 ENCOUNTER — Other Ambulatory Visit: Payer: Self-pay | Admitting: Internal Medicine

## 2017-03-19 ENCOUNTER — Other Ambulatory Visit: Payer: 59

## 2017-03-19 ENCOUNTER — Other Ambulatory Visit (INDEPENDENT_AMBULATORY_CARE_PROVIDER_SITE_OTHER): Payer: 59

## 2017-03-19 DIAGNOSIS — E559 Vitamin D deficiency, unspecified: Secondary | ICD-10-CM

## 2017-03-19 DIAGNOSIS — Z13220 Encounter for screening for lipoid disorders: Secondary | ICD-10-CM

## 2017-03-19 DIAGNOSIS — Z1329 Encounter for screening for other suspected endocrine disorder: Secondary | ICD-10-CM

## 2017-03-19 DIAGNOSIS — Z79899 Other long term (current) drug therapy: Secondary | ICD-10-CM

## 2017-03-19 DIAGNOSIS — E8881 Metabolic syndrome: Secondary | ICD-10-CM

## 2017-03-19 DIAGNOSIS — R5383 Other fatigue: Secondary | ICD-10-CM

## 2017-03-19 DIAGNOSIS — Z130 Encounter for screening for diseases of the blood and blood-forming organs and certain disorders involving the immune mechanism: Secondary | ICD-10-CM

## 2017-03-19 LAB — CBC WITH DIFFERENTIAL/PLATELET
BASOS ABS: 0 {cells}/uL (ref 0–200)
Basophils Relative: 0 %
EOS PCT: 1 %
Eosinophils Absolute: 89 cells/uL (ref 15–500)
HCT: 39.5 % (ref 35.0–45.0)
HEMOGLOBIN: 12.9 g/dL (ref 11.7–15.5)
LYMPHS ABS: 2581 {cells}/uL (ref 850–3900)
Lymphocytes Relative: 29 %
MCH: 29.4 pg (ref 27.0–33.0)
MCHC: 32.7 g/dL (ref 32.0–36.0)
MCV: 90 fL (ref 80.0–100.0)
MPV: 10.1 fL (ref 7.5–12.5)
Monocytes Absolute: 534 cells/uL (ref 200–950)
Monocytes Relative: 6 %
NEUTROS PCT: 64 %
Neutro Abs: 5696 cells/uL (ref 1500–7800)
Platelets: 258 10*3/uL (ref 140–400)
RBC: 4.39 MIL/uL (ref 3.80–5.10)
RDW: 13.4 % (ref 11.0–15.0)
WBC: 8.9 10*3/uL (ref 3.8–10.8)

## 2017-03-19 LAB — TSH: TSH: 1.8 m[IU]/L

## 2017-03-20 LAB — HEPATIC FUNCTION PANEL
ALK PHOS: 39 U/L (ref 33–115)
ALT: 16 U/L (ref 6–29)
AST: 19 U/L (ref 10–30)
Albumin: 4.3 g/dL (ref 3.6–5.1)
BILIRUBIN DIRECT: 0.1 mg/dL (ref <=0.2)
BILIRUBIN INDIRECT: 0.3 mg/dL (ref 0.2–1.2)
TOTAL PROTEIN: 6.5 g/dL (ref 6.1–8.1)
Total Bilirubin: 0.4 mg/dL (ref 0.2–1.2)

## 2017-03-20 LAB — IRON AND TIBC
%SAT: 31 % (ref 11–50)
Iron: 107 ug/dL (ref 40–190)
TIBC: 349 ug/dL (ref 250–450)
UIBC: 242 ug/dL

## 2017-03-20 LAB — LIPID PANEL
CHOL/HDL RATIO: 3.4 ratio (ref <5.0)
CHOLESTEROL: 158 mg/dL (ref <200)
HDL: 46 mg/dL — ABNORMAL LOW (ref >50)
LDL CALC: 90 mg/dL (ref <100)
Triglycerides: 108 mg/dL (ref <150)
VLDL: 22 mg/dL (ref <30)

## 2017-03-20 LAB — VITAMIN D 25 HYDROXY (VIT D DEFICIENCY, FRACTURES): Vit D, 25-Hydroxy: 89 ng/mL (ref 30–100)

## 2017-03-20 LAB — BASIC METABOLIC PANEL WITH GFR
BUN: 12 mg/dL (ref 7–25)
CHLORIDE: 102 mmol/L (ref 98–110)
CO2: 23 mmol/L (ref 20–31)
Calcium: 9.1 mg/dL (ref 8.6–10.2)
Creat: 0.72 mg/dL (ref 0.50–1.10)
GFR, Est African American: >89 mL/min (ref >=60)
GLUCOSE: 74 mg/dL (ref 65–99)
POTASSIUM: 4 mmol/L (ref 3.5–5.3)
SODIUM: 140 mmol/L (ref 135–146)

## 2017-03-20 LAB — MAGNESIUM: Magnesium: 1.9 mg/dL (ref 1.5–2.5)

## 2017-03-20 LAB — HEMOGLOBIN A1C
HEMOGLOBIN A1C: 5.1 % (ref <5.7)
MEAN PLASMA GLUCOSE: 100 mg/dL

## 2017-03-20 LAB — INSULIN, RANDOM: Insulin: 5.8 u[IU]/mL (ref 2.0–19.6)

## 2017-03-20 LAB — VITAMIN B12: VITAMIN B 12: 473 pg/mL (ref 200–1100)

## 2017-03-26 ENCOUNTER — Ambulatory Visit (INDEPENDENT_AMBULATORY_CARE_PROVIDER_SITE_OTHER): Payer: 59 | Admitting: *Deleted

## 2017-03-26 DIAGNOSIS — Z23 Encounter for immunization: Secondary | ICD-10-CM | POA: Diagnosis not present

## 2017-05-16 ENCOUNTER — Other Ambulatory Visit: Payer: 59

## 2017-05-16 DIAGNOSIS — R350 Frequency of micturition: Secondary | ICD-10-CM

## 2017-05-17 LAB — URINALYSIS W MICROSCOPIC + REFLEX CULTURE
BACTERIA UA: NONE SEEN /HPF
Bilirubin Urine: NEGATIVE
Glucose, UA: NEGATIVE
Hgb urine dipstick: NEGATIVE
Hyaline Cast: NONE SEEN /LPF
KETONES UR: NEGATIVE
LEUKOCYTE ESTERASE: NEGATIVE
Nitrites, Initial: NEGATIVE
PH: 8 (ref 5.0–8.0)
Protein, ur: NEGATIVE
RBC / HPF: NONE SEEN /HPF (ref 0–2)
SPECIFIC GRAVITY, URINE: 1.01 (ref 1.001–1.03)
Squamous Epithelial / LPF: NONE SEEN /HPF (ref <=5)
WBC, UA: NONE SEEN /HPF (ref 0–5)

## 2017-05-17 LAB — NO CULTURE INDICATED

## 2017-05-27 ENCOUNTER — Other Ambulatory Visit: Payer: Self-pay | Admitting: Internal Medicine

## 2017-05-27 DIAGNOSIS — O210 Mild hyperemesis gravidarum: Secondary | ICD-10-CM

## 2017-05-27 MED ORDER — ONDANSETRON HCL 8 MG PO TABS: ORAL_TABLET | ORAL | 3 refills | Status: AC

## 2017-05-27 MED ORDER — METRONIDAZOLE 0.75 % VA GEL: 1.0000 | Freq: Two times a day (BID) | VAGINAL | 3 refills | Status: DC

## 2017-05-30 ENCOUNTER — Other Ambulatory Visit: Payer: Self-pay | Admitting: Internal Medicine

## 2017-05-30 DIAGNOSIS — R350 Frequency of micturition: Secondary | ICD-10-CM

## 2017-05-30 DIAGNOSIS — J029 Acute pharyngitis, unspecified: Secondary | ICD-10-CM

## 2017-05-30 MED ORDER — AZITHROMYCIN 250 MG PO TABS: ORAL_TABLET | ORAL | 1 refills | Status: DC

## 2017-06-04 LAB — OB RESULTS CONSOLE ABO/RH: RH TYPE: POSITIVE

## 2017-06-04 LAB — OB RESULTS CONSOLE HIV ANTIBODY (ROUTINE TESTING): HIV: NONREACTIVE

## 2017-06-04 LAB — OB RESULTS CONSOLE RPR: RPR: NONREACTIVE

## 2017-06-04 LAB — OB RESULTS CONSOLE ANTIBODY SCREEN: ANTIBODY SCREEN: NEGATIVE

## 2017-06-04 LAB — OB RESULTS CONSOLE GC/CHLAMYDIA
Chlamydia: NEGATIVE
Gonorrhea: NEGATIVE

## 2017-06-04 LAB — OB RESULTS CONSOLE HEPATITIS B SURFACE ANTIGEN: HEP B S AG: NEGATIVE

## 2017-06-04 LAB — OB RESULTS CONSOLE RUBELLA ANTIBODY, IGM: RUBELLA: IMMUNE

## 2017-06-10 ENCOUNTER — Ambulatory Visit (INDEPENDENT_AMBULATORY_CARE_PROVIDER_SITE_OTHER): Payer: 59

## 2017-06-10 DIAGNOSIS — Z23 Encounter for immunization: Secondary | ICD-10-CM | POA: Diagnosis not present

## 2017-06-30 ENCOUNTER — Other Ambulatory Visit: Payer: Self-pay | Admitting: Internal Medicine

## 2017-06-30 MED ORDER — PROMETHAZINE HCL 25 MG RE SUPP: RECTAL | 3 refills | Status: DC

## 2017-07-08 ENCOUNTER — Other Ambulatory Visit: Payer: 59

## 2017-07-08 DIAGNOSIS — R350 Frequency of micturition: Secondary | ICD-10-CM

## 2017-07-09 LAB — URINE CULTURE
MICRO NUMBER:: 81305582
Result:: NO GROWTH
SPECIMEN QUALITY:: ADEQUATE

## 2017-07-09 LAB — URINALYSIS, ROUTINE W REFLEX MICROSCOPIC
BILIRUBIN URINE: NEGATIVE
GLUCOSE, UA: NEGATIVE
HGB URINE DIPSTICK: NEGATIVE
Ketones, ur: NEGATIVE
LEUKOCYTES UA: NEGATIVE
Nitrite: NEGATIVE
PH: 7.5 (ref 5.0–8.0)
Protein, ur: NEGATIVE
Specific Gravity, Urine: 1.002 (ref 1.001–1.03)

## 2017-08-05 ENCOUNTER — Other Ambulatory Visit: Payer: Self-pay | Admitting: Adult Health

## 2017-08-05 ENCOUNTER — Other Ambulatory Visit: Payer: 59

## 2017-08-05 DIAGNOSIS — R42 Dizziness and giddiness: Secondary | ICD-10-CM

## 2017-08-05 DIAGNOSIS — R000 Tachycardia, unspecified: Secondary | ICD-10-CM

## 2017-08-05 NOTE — Progress Notes (Signed)
Patient is [redacted] weeks pregnant; c/o of intermittent tachycardia, HA, dizziness. She has BB for nighttime tachycardia that has not improved recent symptoms. Will check labs to r/o dehydration, anemia, hyperthyroid.

## 2017-08-06 LAB — BASIC METABOLIC PANEL WITH GFR
BUN: 11 mg/dL (ref 7–25)
CALCIUM: 9 mg/dL (ref 8.6–10.2)
CO2: 25 mmol/L (ref 20–32)
CREATININE: 0.54 mg/dL (ref 0.50–1.10)
Chloride: 102 mmol/L (ref 98–110)
GFR, EST NON AFRICAN AMERICAN: 123 mL/min/{1.73_m2} (ref >=60)
GFR, Est African American: 143 mL/min/{1.73_m2} (ref >=60)
GLUCOSE: 111 mg/dL — AB (ref 65–99)
Potassium: 3.6 mmol/L (ref 3.5–5.3)
Sodium: 136 mmol/L (ref 135–146)

## 2017-08-06 LAB — CBC WITH DIFFERENTIAL/PLATELET
BASOS ABS: 23 {cells}/uL (ref 0–200)
BASOS PCT: 0.2 %
EOS PCT: 0.5 %
Eosinophils Absolute: 57 cells/uL (ref 15–500)
HEMATOCRIT: 34 % — AB (ref 35.0–45.0)
HEMOGLOBIN: 11.6 g/dL — AB (ref 11.7–15.5)
LYMPHS ABS: 1915 {cells}/uL (ref 850–3900)
MCH: 29.4 pg (ref 27.0–33.0)
MCHC: 34.1 g/dL (ref 32.0–36.0)
MCV: 86.3 fL (ref 80.0–100.0)
MONOS PCT: 3.4 %
MPV: 10.7 fL (ref 7.5–12.5)
NEUTROS ABS: 9017 {cells}/uL — AB (ref 1500–7800)
Neutrophils Relative %: 79.1 %
Platelets: 234 10*3/uL (ref 140–400)
RBC: 3.94 10*6/uL (ref 3.80–5.10)
RDW: 12.3 % (ref 11.0–15.0)
Total Lymphocyte: 16.8 %
WBC mixed population: 388 cells/uL (ref 200–950)
WBC: 11.4 10*3/uL — ABNORMAL HIGH (ref 3.8–10.8)

## 2017-08-06 LAB — FERRITIN: Ferritin: 52 ng/mL (ref 10–154)

## 2017-08-06 LAB — TSH: TSH: 0.68 m[IU]/L

## 2017-08-06 LAB — IRON, TOTAL/TOTAL IRON BINDING CAP
%SAT: 21 % (ref 11–50)
Iron: 77 ug/dL (ref 40–190)
TIBC: 368 mcg/dL (calc) (ref 250–450)

## 2017-08-20 NOTE — L&D Delivery Note (Signed)
Delivery Note  At 9:02 AM a viable female was delivered via Vaginal, Spontaneous (Presentation: OA to ROT  ).  APGAR: 8, 9; weight 8 lb 6.6 oz (3815 g).   Placenta status: S/C/I, [place].  Cord: 3VC  with the following complications: none.  Anesthesia:  Epidural (dislodged in 2nd stage, patient able to cope and push well) Episiotomy: None Lacerations: 2nd degree perineal and R labial splay Suture Repair: 2.0 3.0 vicryl Est. Blood Loss (mL): 200  Mom to postpartum.  Baby to Couplet care / Skin to Skin.  [name], CNM 01/11/2018, 10:30 AM

## 2017-09-17 ENCOUNTER — Other Ambulatory Visit: Payer: 59

## 2017-09-17 ENCOUNTER — Other Ambulatory Visit: Payer: Self-pay | Admitting: Internal Medicine

## 2017-09-17 DIAGNOSIS — R300 Dysuria: Secondary | ICD-10-CM

## 2017-09-18 LAB — URINE CULTURE
MICRO NUMBER:: 90125187
RESULT: NO GROWTH
SPECIMEN QUALITY: ADEQUATE

## 2017-09-18 LAB — URINALYSIS, ROUTINE W REFLEX MICROSCOPIC
BILIRUBIN URINE: NEGATIVE
Glucose, UA: NEGATIVE
HGB URINE DIPSTICK: NEGATIVE
KETONES UR: NEGATIVE
Leukocytes, UA: NEGATIVE
NITRITE: NEGATIVE
PROTEIN: NEGATIVE
SPECIFIC GRAVITY, URINE: 1.008 (ref 1.001–1.03)
pH: 8 (ref 5.0–8.0)

## 2017-09-25 ENCOUNTER — Ambulatory Visit: Payer: 59 | Admitting: Adult Health

## 2017-09-25 ENCOUNTER — Encounter: Payer: Self-pay | Admitting: Adult Health

## 2017-09-25 VITALS — BP 118/72 | HR 112 | Temp 99.1°F

## 2017-09-25 DIAGNOSIS — R6889 Other general symptoms and signs: Secondary | ICD-10-CM

## 2017-09-25 LAB — POCT INFLUENZA A/B
Influenza A, POC: NEGATIVE
Influenza B, POC: NEGATIVE

## 2017-09-25 NOTE — Progress Notes (Signed)
Subjective:     Pamela Walton is a 37 y.o. female who presents for evaluation of influenza like symptoms. Symptoms include chills, dry cough, headache, myalgias and fever and have been present for 1 day. She has tried to alleviate the symptoms with acetaminophen and robitussin, nasonex, cough drops with minimal relief. High risk factors for influenza complications: [redacted] weeks pregnant.  The following portions of the patient's history were reviewed and updated as appropriate: allergies, current medications, past family history, past medical history, past social history, past surgical history and problem list.  Review of Systems Pertinent items noted in HPI and remainder of comprehensive ROS otherwise negative.     Objective:    BP 118/72   Pulse (!) 112   Temp 99.1 F (37.3 C)   SpO2 96%   General Appearance:    Alert, cooperative, no distress, appears stated age  Head:    Normocephalic, without obvious abnormality, atraumatic  Eyes:    PERRL, conjunctiva/corneas clear, EOM's intact, fundi    benign, both eyes  Ears:    Normal TM's and external ear canals, both ears  Nose:   Nares normal, septum midline, mucosa normal, no drainage    or sinus tenderness  Throat:   Lips, mucosa, and tongue normal; teeth and gums normal  Neck:   Supple, symmetrical, trachea midline, no adenopathy;    thyroid:  no enlargement/tenderness/nodules; no carotid   bruit or JVD  Back:     Symmetric, no curvature, ROM normal, no CVA tenderness  Lungs:     Clear to auscultation bilaterally, respirations unlabored  Chest Wall:    No tenderness or deformity   Heart:    Regular rate and rhythm, S1 and S2 normal, no murmur, rub   or gallop  Breast Exam:    No tenderness, masses, or nipple abnormality  Abdomen:     Soft, distended as expected for pregnancy, non-tender, bowel sounds active all four quadrants,    no masses, no organomegaly        Extremities:   Extremities normal, atraumatic, no cyanosis or edema   Pulses:   2+ and symmetric all extremities  Skin:   Skin color, texture, turgor normal, no rashes or lesions  Lymph nodes:   Cervical, supraclavicular, and axillary nodes normal  Neurologic:   CNII-XII intact, normal strength, sensation and reflexes    throughout      Assessment:    Influenza like syndrome    Flu swab screen negative -    Plan:    Supportive care with appropriate antipyretics and fluids.

## 2017-09-25 NOTE — Patient Instructions (Signed)
Pregnancy and Influenza Influenza, also called the flu, is an infection of the respiratory tract. If you are pregnant, you are more likely to catch the flu. You are also more likely to have a more serious case of the flu. This is because pregnancy lowers your body's ability to fight off infections (it weakens your immune system). It also puts additional stress on your heart and lungs, which makes you more likely to have complications. Having a bad case of the flu, especially with a high fever, can be dangerous for your developing baby. It can cause you to go into early labor. How do people get the flu? The flu is caused by the influenza virus. This virus is common every year in the fall and winter. It spreads when virus particles get passed from person to person. You can get the virus if you are near a sick person who is coughing or sneezing. You can also get the virus if you touch something that has the virus on it and then touch your face. How can I protect myself against the flu?  Get a flu shot. The best way to prevent the flu is to get a flu shot before flu season starts. The flu shot is not dangerous for your developing baby. It may even help protect your baby from the flu for up to 6 months after birth. The flu shot is one type of flu vaccine. Another type is a nasal spray vaccine. Do not get the nasal spray vaccine. It is not approved for pregnancy.  Do not come in close contact with sick people.  Do not share food, drinks, or utensils with other people.  Wash your hands often. Use hand sanitizer when soap and water are not available. What should I do if I have flu symptoms? If you have any flu symptoms, call your health care provider right away. Flu symptoms include:  Fever or chills.  Muscle aches.  Headache.  Sore throat.  Nasal congestion.  Cough.  Feeling tired.  Loss of appetite.  Vomiting.  Diarrhea.  You may be able to take an antiviral medicine to keep the flu  from becoming severe and to shorten how long it lasts. What should I do at home if I am diagnosed with the flu?  Do not take any medicine, including cold or flu medicine, unless directed by your health care provider.  If you take antiviral medicine, make sure you finish it even if you start to feel better.  Drink enough fluid to keep your urine clear or pale yellow.  Get plenty of rest. When would I seek immediate medical care if I have the flu?  You have trouble breathing.  You have chest pain.  You begin to have labor pains.  You have a high fever that does not go down after you take medicine.  You do not feel your baby move.  You have diarrhea or vomiting that will not go away. This information is not intended to replace advice given to you by your health care provider. Make sure you discuss any questions you have with your health care provider. Document Released: 06/08/2008 Document Revised: 01/12/2016 Document Reviewed: 07/03/2013 Elsevier Interactive Patient Education  2017 [employer].

## 2017-10-31 DIAGNOSIS — Z23 Encounter for immunization: Secondary | ICD-10-CM

## 2017-11-05 ENCOUNTER — Other Ambulatory Visit: Payer: 59

## 2017-11-05 ENCOUNTER — Other Ambulatory Visit: Payer: Self-pay | Admitting: Internal Medicine

## 2017-11-05 DIAGNOSIS — R350 Frequency of micturition: Secondary | ICD-10-CM

## 2017-11-05 DIAGNOSIS — R300 Dysuria: Secondary | ICD-10-CM

## 2017-11-06 LAB — URINALYSIS, ROUTINE W REFLEX MICROSCOPIC
Bilirubin Urine: NEGATIVE
Glucose, UA: NEGATIVE
HGB URINE DIPSTICK: NEGATIVE
KETONES UR: NEGATIVE
LEUKOCYTES UA: NEGATIVE
NITRITE: NEGATIVE
PROTEIN: NEGATIVE
Specific Gravity, Urine: 1.015 (ref 1.001–1.03)
pH: 7 (ref 5.0–8.0)

## 2017-11-06 LAB — URINE CULTURE
MICRO NUMBER:: 90346380
RESULT: NO GROWTH
SPECIMEN QUALITY:: ADEQUATE

## 2017-12-05 LAB — OB RESULTS CONSOLE GBS: GBS: POSITIVE

## 2018-01-02 ENCOUNTER — Encounter (HOSPITAL_COMMUNITY): Payer: Self-pay | Admitting: *Deleted

## 2018-01-02 ENCOUNTER — Telehealth (HOSPITAL_COMMUNITY): Payer: Self-pay | Admitting: *Deleted

## 2018-01-02 NOTE — Telephone Encounter (Signed)
Preadmission screen  

## 2018-01-07 ENCOUNTER — Inpatient Hospital Stay (HOSPITAL_COMMUNITY): Admission: AD | Admit: 2018-01-07 | Payer: 59 | Source: Ambulatory Visit | Admitting: Obstetrics and Gynecology

## 2018-01-08 ENCOUNTER — Other Ambulatory Visit: Payer: Self-pay | Admitting: Certified Nurse Midwife

## 2018-01-10 ENCOUNTER — Inpatient Hospital Stay (HOSPITAL_COMMUNITY)
Admission: RE | Admit: 2018-01-10 | Discharge: 2018-01-12 | DRG: 807 | Disposition: A | Discharge status: Home / Self Care | Payer: 59 | Source: Ambulatory Visit | Attending: Obstetrics and Gynecology | Admitting: Obstetrics and Gynecology

## 2018-01-10 ENCOUNTER — Encounter (HOSPITAL_COMMUNITY): Payer: Self-pay

## 2018-01-10 DIAGNOSIS — Z3A38 38 weeks gestation of pregnancy: Secondary | ICD-10-CM | POA: Diagnosis not present

## 2018-01-10 DIAGNOSIS — O99824 Streptococcus B carrier state complicating childbirth: Secondary | ICD-10-CM | POA: Diagnosis present

## 2018-01-10 DIAGNOSIS — O99344 Other mental disorders complicating childbirth: Secondary | ICD-10-CM | POA: Diagnosis present

## 2018-01-10 DIAGNOSIS — F419 Anxiety disorder, unspecified: Secondary | ICD-10-CM | POA: Diagnosis present

## 2018-01-10 DIAGNOSIS — F329 Major depressive disorder, single episode, unspecified: Secondary | ICD-10-CM | POA: Diagnosis present

## 2018-01-10 DIAGNOSIS — Z3490 Encounter for supervision of normal pregnancy, unspecified, unspecified trimester: Secondary | ICD-10-CM | POA: Diagnosis present

## 2018-01-10 DIAGNOSIS — Z3483 Encounter for supervision of other normal pregnancy, third trimester: Secondary | ICD-10-CM | POA: Diagnosis present

## 2018-01-10 LAB — CBC
HCT: 35 % — ABNORMAL LOW (ref 36.0–46.0)
Hemoglobin: 11.7 g/dL — ABNORMAL LOW (ref 12.0–15.0)
MCH: 29.4 pg (ref 26.0–34.0)
MCHC: 33.4 g/dL (ref 30.0–36.0)
MCV: 87.9 fL (ref 78.0–100.0)
PLATELETS: 174 10*3/uL (ref 150–400)
RBC: 3.98 MIL/uL (ref 3.87–5.11)
RDW: 14.6 % (ref 11.5–15.5)
WBC: 14.7 10*3/uL — ABNORMAL HIGH (ref 4.0–10.5)

## 2018-01-10 LAB — RPR: RPR Ser Ql: NONREACTIVE

## 2018-01-10 LAB — TYPE AND SCREEN
ABO/RH(D): A POS
Antibody Screen: NEGATIVE

## 2018-01-10 LAB — ABO/RH: ABO/RH(D): A POS

## 2018-01-10 MED ORDER — METOPROLOL TARTRATE 25 MG PO TABS
25.0000 mg | ORAL_TABLET | Freq: Every morning | ORAL | Status: DC
  Administered 2018-01-11 – 2018-01-12 (×2): 25 mg via ORAL
  Filled 2018-01-10 (×3): qty 1

## 2018-01-10 MED ORDER — MISOPROSTOL 50MCG HALF TABLET
50.0000 ug | ORAL_TABLET | Freq: Once | ORAL | Status: AC
  Administered 2018-01-10: 50 ug via BUCCAL
  Filled 2018-01-10: qty 1

## 2018-01-10 MED ORDER — ACETAMINOPHEN 325 MG PO TABS
650.0000 mg | ORAL_TABLET | ORAL | Status: DC | PRN
  Administered 2018-01-11: 650 mg via ORAL
  Filled 2018-01-10: qty 2

## 2018-01-10 MED ORDER — MISOPROSTOL 25 MCG QUARTER TABLET
25.0000 ug | ORAL_TABLET | ORAL | Status: AC | PRN
  Administered 2018-01-10: 25 ug via ORAL
  Filled 2018-01-10: qty 1

## 2018-01-10 MED ORDER — LACTATED RINGERS IV SOLN
INTRAVENOUS | Status: DC
  Administered 2018-01-10: 01:00:00 via INTRAVENOUS

## 2018-01-10 MED ORDER — ONDANSETRON HCL 4 MG/2ML IJ SOLN
4.0000 mg | Freq: Four times a day (QID) | INTRAMUSCULAR | Status: DC | PRN
  Administered 2018-01-10 – 2018-01-11 (×2): 4 mg via INTRAVENOUS
  Filled 2018-01-10 (×2): qty 2

## 2018-01-10 MED ORDER — LIDOCAINE HCL (PF) 1 % IJ SOLN
30.0000 mL | INTRAMUSCULAR | Status: DC | PRN
  Administered 2018-01-11: 30 mL via SUBCUTANEOUS
  Filled 2018-01-10: qty 30

## 2018-01-10 MED ORDER — DIPHENHYDRAMINE HCL 50 MG/ML IJ SOLN: 12.5000 mg | INTRAMUSCULAR | Status: DC | PRN

## 2018-01-10 MED ORDER — SODIUM CHLORIDE 0.9 % IV SOLN
5.0000 10*6.[IU] | Freq: Once | INTRAVENOUS | Status: AC
  Administered 2018-01-10: 5 10*6.[IU] via INTRAVENOUS
  Filled 2018-01-10: qty 5

## 2018-01-10 MED ORDER — EPHEDRINE 5 MG/ML INJ
10.0000 mg | INTRAVENOUS | Status: DC | PRN
  Filled 2018-01-10: qty 2

## 2018-01-10 MED ORDER — FENTANYL 2.5 MCG/ML BUPIVACAINE 1/10 % EPIDURAL INFUSION (WH - ANES)
14.0000 mL/h | INTRAMUSCULAR | Status: DC | PRN
  Administered 2018-01-11 (×2): 14 mL/h via EPIDURAL
  Filled 2018-01-10 (×2): qty 100

## 2018-01-10 MED ORDER — LACTATED RINGERS IV SOLN: 500.0000 mL | INTRAVENOUS | Status: DC | PRN

## 2018-01-10 MED ORDER — PENICILLIN G POT IN DEXTROSE 60000 UNIT/ML IV SOLN
3.0000 10*6.[IU] | INTRAVENOUS | Status: DC
  Administered 2018-01-10 – 2018-01-11 (×6): 3 10*6.[IU] via INTRAVENOUS
  Filled 2018-01-10 (×9): qty 50

## 2018-01-10 MED ORDER — OXYTOCIN 40 UNITS IN LACTATED RINGERS INFUSION - SIMPLE MED
1.0000 m[IU]/min | INTRAVENOUS | Status: DC
  Administered 2018-01-10: 2 m[IU]/min via INTRAVENOUS
  Administered 2018-01-11: 10 m[IU]/min via INTRAVENOUS
  Filled 2018-01-10 (×2): qty 1000

## 2018-01-10 MED ORDER — SOD CITRATE-CITRIC ACID 500-334 MG/5ML PO SOLN
30.0000 mL | ORAL | Status: DC | PRN
  Administered 2018-01-10 – 2018-01-11 (×2): 30 mL via ORAL
  Filled 2018-01-10 (×2): qty 15

## 2018-01-10 MED ORDER — PHENYLEPHRINE 40 MCG/ML (10ML) SYRINGE FOR IV PUSH (FOR BLOOD PRESSURE SUPPORT)
80.0000 ug | PREFILLED_SYRINGE | INTRAVENOUS | Status: DC | PRN
  Filled 2018-01-10: qty 5

## 2018-01-10 MED ORDER — PHENYLEPHRINE 40 MCG/ML (10ML) SYRINGE FOR IV PUSH (FOR BLOOD PRESSURE SUPPORT)
80.0000 ug | PREFILLED_SYRINGE | INTRAVENOUS | Status: DC | PRN
  Filled 2018-01-10: qty 5
  Filled 2018-01-10: qty 10

## 2018-01-10 MED ORDER — METOPROLOL TARTRATE 50 MG PO TABS
50.0000 mg | ORAL_TABLET | Freq: Every day | ORAL | Status: DC
  Administered 2018-01-10 – 2018-01-11 (×2): 50 mg via ORAL
  Filled 2018-01-10 (×3): qty 1

## 2018-01-10 MED ORDER — LACTATED RINGERS IV SOLN
500.0000 mL | Freq: Once | INTRAVENOUS | Status: AC
  Administered 2018-01-11: 500 mL via INTRAVENOUS

## 2018-01-10 MED ORDER — TERBUTALINE SULFATE 1 MG/ML IJ SOLN
0.2500 mg | Freq: Once | INTRAMUSCULAR | Status: DC | PRN
  Filled 2018-01-10: qty 1

## 2018-01-10 MED ORDER — MISOPROSTOL 50MCG HALF TABLET
50.0000 ug | ORAL_TABLET | Freq: Once | ORAL | Status: AC
Start: 2018-01-10 — End: 2018-01-10
  Administered 2018-01-10: 50 ug via ORAL
  Filled 2018-01-10: qty 1

## 2018-01-10 NOTE — Anesthesia Pain Management Evaluation Note (Signed)
  CRNA Pain Management Visit Note  Patient: Pamela Walton, 37 y.o., female  "Hello I am a member of the anesthesia team at [facility]. We have an anesthesia team available at all times to provide care throughout the hospital, including epidural management and anesthesia for C-section. I don't know your plan for the delivery whether it a natural birth, water birth, IV sedation, nitrous supplementation, doula or epidural, but we want to meet your pain goals."   1.Was your pain managed to your expectations on prior hospitalizations?   No prior hospitalizations  2.What is your expectation for pain management during this hospitalization?     Epidural  3.How can we help you reach that goal? unsure  Record the patient's initial score and the patient's pain goal.   Pain: 0  Pain Goal: 8 [facility] wants you to be able to say your pain was always managed very well.  Casimer Lanius 01/10/2018

## 2018-01-10 NOTE — Progress Notes (Signed)
S: Comfortable, using BB at [facility], spouse keeping company.  On Pitocin infusion. 10 mu/min  O: Vitals:   01/10/18 0840 01/10/18 0938 01/10/18 1019 01/10/18 1038  BP: 132/77 136/72 (!) 119/106 128/70  Pulse: (!) 101 95 96 91  Resp:  16 16   Temp:      TempSrc:      Weight:      Height:         FHT:  FHR: 130 bpm, variability: moderate,  accelerations:  Present,  decelerations:  Absent UC:   regular, every 2-4 minutes, mild SVE:   Dilation: 1 Effacement (%): 60 Station: -3 Exam by:: Memorie Yokoyama, cnm(RN not present for exam)   A / P: Induction of labor - elective Cervix unfavorable, will check back after 1-2 hours, if membrane sweep not effective, will change from Pitocin to cervical balloon and rpt miso Fetal Wellbeing:  Category I Pain Control:  plans epidural in active labor  Anticipated MOD:  NSVD  [name], CNM, MSN 01/10/2018, 11:17 AM

## 2018-01-10 NOTE — Progress Notes (Signed)
S: Resting in bed, ctx feel painful but coping well. Has been using birthing ball and resting in bed intermittently. Lots of bloody DC noted.   O: Blood pressure 118/81, pulse 88, temperature 98.8 F (37.1 C), temperature source Oral, resp. rate 18, height 5\' 7"  (1.702 m), weight 88.2 kg (194 lb 6.4 oz).  FHR 135 BL, mod var, + accels, no decels Ctyx; Q 2-3 min, mild/mod to palp  Cvx 5/70/-1, cervical balloon dislodged, removed from vagina with ease. Membrane sweep done.    A/P: IOL/cervical ripening effective. S/P 3rd dose cytotec at 2000 Continue ripening, effacement ongoing Will start Pitocin augmentation after 4 hours if no physiologic labor ensues.   FHT cat 1 GBS positive - ongoing prophylaxis adequate.   Pain - coping well at present, plans epidural in active labor  [name], MSN, CNM 01/10/2018, 8:51 PM

## 2018-01-10 NOTE — H&P (Signed)
OB ADMISSION/ HISTORY & PHYSICAL:  Admission Date: 01/10/2018 12:08 AM  Admit Diagnosis: Term pregnancy  Pamela Walton is a 37 y.o. female presenting for IOL at term, elective.  Cervical ripening overnight with misoprostol, then Pitocin started early this morning. Feeling mild ctx, comfortable. Desires to labor unmedicated as long as possible, open to epidural as needed. .  Prenatal History: G1P0   EDC : 01/07/2018, by [facility]  Prenatal care at [facility] since [redacted] weeks gestation  Prenatal course complicated by hx cardiac dysrhythmia / sinus tachy, stable on Labetalol initially, then changed to Metoprolol at 38 weeks.  Prenatal Labs: ABO, Rh: A (10/16 0000)  Antibody: NEG (05/24 0100) Rubella: Immune (10/16 0000)  RPR: Nonreactive (10/16 0000)  HBsAg: Negative (10/16 0000)  HIV: Non-reactive (10/16 0000)  GBS: Positive (04/18 0000)  1 hr Glucola : 100 Anatomy US, wnl, growth scans AGA Genetica, AFP and NIPS normal   Medical / Surgical History :  Past medical history:  Past Medical History:  Diagnosis Date  . Dysrhythmia    svt     Past surgical history:  Past Surgical History:  Procedure Laterality Date  . NO PAST SURGERIES       Family History:  Family History  Problem Relation Age of Onset  . Diabetes Mother   . Cancer Mother        breast, cervical  . Cancer Father   . Heart attack Father      Social History:  reports that she has never smoked. She has never used smokeless tobacco. She reports that she drank alcohol. She reports that she does not use drugs.   Allergies: Patient has no known allergies.    Current Medications at time of admission:  Medications Prior to Admission  Medication Sig Dispense Refill Last Dose  . Cholecalciferol (VITAMIN D3) 5000 UNITS CAPS Take 5,000 Units by mouth every morning.   01/09/2018 at Unknown time  . labetalol (NORMODYNE) 100 MG tablet Take 1 tablet (100 mg total) by mouth 2 (two) times daily.  60 tablet 5 Past Week at Unknown time  . Magnesium 500 MG TABS Take by mouth daily.   01/09/2018 at Unknown time  . metoprolol tartrate (LOPRESSOR) 50 MG tablet Take 50 mg by mouth 2 (two) times daily.   Past Week at Unknown time  . ranitidine (ZANTAC) 150 MG tablet Take 150 mg by mouth 2 (two) times daily.   01/09/2018 at Unknown time  . cyclobenzaprine (FLEXERIL) 10 MG tablet Take 1/2 to 1 tablet 3 x day as needed for muscle spasm (Patient not taking: Reported on 01/10/2018) 90 tablet 1 Not Taking at Unknown time  . fluconazole (DIFLUCAN) 150 MG tablet Take 1 tablet 3 x / week 12 tablet 1   . hyoscyamine (LEVSIN/SL) 0.125 MG SL tablet Dissolve 1 to 2 tablets under the tongue every 4 hours as needed for nausea, cramping , bloating or diarrhea (Patient not taking: Reported on 01/10/2018) 100 tablet 1 Not Taking at Unknown time  . metoprolol tartrate (LOPRESSOR) 100 MG tablet TAKE ONE TABLET BY MOUTH TWICE DAILY AS DIRECTED 30 tablet 5   . omeprazole (PRILOSEC) 40 MG capsule Take 1 capsule (40 mg total) by mouth daily. 30 capsule 1   . promethazine (PHENERGAN) 25 MG suppository 1 supp rectally every 4 hours if needed for nausea / vomitting (Patient not taking: Reported on 01/10/2018) 12 each 3 Not Taking at Unknown time      Review of Systems:  ROS  As noted above  Physical Exam:  Dilation: 1 Effacement (%): 60 Station: -3 Exam by:: Saben Donigan, cnm(RN not present for exam) VSSAF General: AAO x 3, NAD Heart: RRR Lungs: CTAB Abdomen: gravid, NT Extremities: +1 edema pedal Genitalia: no lesions FHR: 125, mod var, + accels, no decels TOCO: q 2-4 min, mild  Labs:    Recent Labs    01/10/18 0100  WBC 14.7*  HGB 11.7*  HCT 35.0*  PLT 174       Assessment:  37 y.o. G1P0 at 107w3d  1. IOL - cervical ripening overnight / 1 dose cytotec, now on pitocin 2. FHR category 1 3. GBS positive 4. Desires freedom of movement and unmedicated birth as much as possible 5. Breastfeeding  planned   Plan:  1. Cervix unfavorable, membrane sweep done, will observe for next couple hours on Pitocin, if no significant change in ctx intensity will resume ripening with oral cyctotec and foley balloon 2. GBS prophylaxis per protocol ongoing 3. Anticipate NSVB   Dr Murrell Redden notified of admission / plan of care   [place], MSN 01/10/2018, 11:17 AM

## 2018-01-11 ENCOUNTER — Inpatient Hospital Stay (HOSPITAL_COMMUNITY): Payer: 59 | Admitting: Anesthesiology

## 2018-01-11 ENCOUNTER — Encounter (HOSPITAL_COMMUNITY): Payer: Self-pay

## 2018-01-11 MED ORDER — ONDANSETRON HCL 4 MG PO TABS: 4.0000 mg | ORAL_TABLET | ORAL | Status: DC | PRN

## 2018-01-11 MED ORDER — BENZOCAINE-MENTHOL 20-0.5 % EX AERO
1.0000 "application " | INHALATION_SPRAY | CUTANEOUS | Status: DC | PRN
  Administered 2018-01-11: 1 via TOPICAL
  Filled 2018-01-11: qty 56

## 2018-01-11 MED ORDER — BACID PO TABS
2.0000 | ORAL_TABLET | Freq: Three times a day (TID) | ORAL | Status: DC
  Administered 2018-01-11 – 2018-01-12 (×4): 2 via ORAL
  Filled 2018-01-11 (×6): qty 2

## 2018-01-11 MED ORDER — LIDOCAINE HCL (PF) 1 % IJ SOLN
INTRAMUSCULAR | Status: DC | PRN
  Administered 2018-01-11: 5 mL via EPIDURAL
  Administered 2018-01-11: 7 mL via EPIDURAL

## 2018-01-11 MED ORDER — OXYTOCIN 40 UNITS IN LACTATED RINGERS INFUSION - SIMPLE MED
999.0000 [IU]/h | INTRAVENOUS | Status: DC
  Administered 2018-01-11: 999 [IU]/h via INTRAVENOUS

## 2018-01-11 MED ORDER — DIBUCAINE 1 % RE OINT: 1.0000 "application " | TOPICAL_OINTMENT | RECTAL | Status: DC | PRN

## 2018-01-11 MED ORDER — ZOLPIDEM TARTRATE 5 MG PO TABS: 5.0000 mg | ORAL_TABLET | Freq: Every evening | ORAL | Status: DC | PRN

## 2018-01-11 MED ORDER — WITCH HAZEL-GLYCERIN EX PADS: 1.0000 "application " | MEDICATED_PAD | CUTANEOUS | Status: DC | PRN

## 2018-01-11 MED ORDER — TETANUS-DIPHTH-ACELL PERTUSSIS 5-2.5-18.5 LF-MCG/0.5 IM SUSP: 0.5000 mL | Freq: Once | INTRAMUSCULAR | Status: DC

## 2018-01-11 MED ORDER — SENNOSIDES-DOCUSATE SODIUM 8.6-50 MG PO TABS
2.0000 | ORAL_TABLET | ORAL | Status: DC
  Administered 2018-01-11: 2 via ORAL
  Filled 2018-01-11: qty 2

## 2018-01-11 MED ORDER — PRENATAL MULTIVITAMIN CH
1.0000 | ORAL_TABLET | Freq: Every day | ORAL | Status: DC
  Administered 2018-01-12: 1 via ORAL
  Filled 2018-01-11: qty 1

## 2018-01-11 MED ORDER — COCONUT OIL OIL: 1.0000 "application " | TOPICAL_OIL | Status: DC | PRN

## 2018-01-11 MED ORDER — BISACODYL 10 MG RE SUPP: 10.0000 mg | Freq: Every day | RECTAL | Status: DC | PRN

## 2018-01-11 MED ORDER — ONDANSETRON HCL 4 MG/2ML IJ SOLN: 4.0000 mg | INTRAMUSCULAR | Status: DC | PRN

## 2018-01-11 MED ORDER — OXYTOCIN 40 UNITS IN LACTATED RINGERS INFUSION - SIMPLE MED
2.5000 [IU]/h | INTRAVENOUS | Status: DC
Start: 2018-01-11 — End: 2018-01-11
  Administered 2018-01-11: 2.5 [IU]/h via INTRAVENOUS

## 2018-01-11 MED ORDER — SIMETHICONE 80 MG PO CHEW: 80.0000 mg | CHEWABLE_TABLET | ORAL | Status: DC | PRN

## 2018-01-11 MED ORDER — ACETAMINOPHEN 325 MG PO TABS
650.0000 mg | ORAL_TABLET | ORAL | Status: DC | PRN
  Administered 2018-01-11 – 2018-01-12 (×2): 650 mg via ORAL
  Filled 2018-01-11 (×2): qty 2

## 2018-01-11 MED ORDER — FLEET ENEMA 7-19 GM/118ML RE ENEM: 1.0000 | ENEMA | Freq: Every day | RECTAL | Status: DC | PRN

## 2018-01-11 MED ORDER — DIPHENHYDRAMINE HCL 25 MG PO CAPS
25.0000 mg | ORAL_CAPSULE | Freq: Four times a day (QID) | ORAL | Status: DC | PRN
Start: 2018-01-11 — End: 2018-01-12

## 2018-01-11 MED ORDER — IBUPROFEN 600 MG PO TABS
600.0000 mg | ORAL_TABLET | Freq: Four times a day (QID) | ORAL | Status: DC
  Administered 2018-01-11 – 2018-01-12 (×5): 600 mg via ORAL
  Filled 2018-01-11 (×6): qty 1

## 2018-01-11 NOTE — Lactation Note (Signed)
This note was copied from a baby's chart. Lactation Consultation Note  Patient Name: Pamela Walton BSWHQ'P Date: 01/11/2018 Reason for consult: Initial assessment;Term;Primapara;1st time breastfeeding  38 hours old FT female who is being exclusively BF by her mother even though she does plan to supplement with formula at some point, that was her feeding choice upon admission. Baby was already nursing when entering the room, per mom he had just finished nursing of the right breast and now she was doing the left one in football position. No swallows were heard though, but LC was able to see a little tiny bit on milk in the corner of baby's mouth.  Per mom feedings at the breast are comfortable; both of her nipples looked intact upon examination with no signs of trauma. Baby has been feeding off both breast with no issues or concern. 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 She's also aware that baby is at risk of jaundice and willing to pump to provide extra calories and volume for her.  Set mom up with a DEBP, pump instructions, cleaning and storage were reviewed, as well as milk storage guidelines. Parents will finger feed or spoon feed baby any amount of EBM she'll get at each pumping session. Taught mom how to hand express, and also showed dad how to burp baby, he did the teach back.  Encouraged mom to keep feeding baby STS 8-12 times/24 or sooner if feeding cues are present. She'll also pump afterwards around 6-8 times/24 hours. BF brochure, BF resources and feeding diary were shared. Parents aware of [facility] services and will call PRN.  Maternal Data Formula Feeding for Exclusion: No Has patient been taught Hand Expression?: Yes Does the patient have breastfeeding experience prior to this delivery?: No  Feeding Feeding Type: Breast Fed Length of feed: 25 min  LATCH Score Latch: Grasps breast easily, tongue down, lips flanged,  rhythmical sucking.  Audible Swallowing: None  Type of Nipple: Everted at rest and after stimulation  Comfort (Breast/Nipple): Soft / non-tender  Hold (Positioning): No assistance needed to correctly position infant at breast.(Minimal assistance)  LATCH Score: 8  Interventions Interventions: Breast feeding basics reviewed;Assisted with latch;Skin to skin;Breast massage;Hand express;Breast compression;Adjust position;Reverse pressure;Support pillows;Position options;Expressed milk;DEBP  Lactation Tools Discussed/Used Tools: Pump Breast pump type: Double-Electric Breast Pump WIC Program: No Pump Review: Setup, frequency, and cleaning;Milk Storage Initiated by:: MPeck Date initiated:: 01/12/18   Consult Status Consult Status: Follow-up Date: 01/12/18 Follow-up type: In-patient    Mackinzee Roszak Francene Boyers 01/11/2018, 9:31 PM

## 2018-01-11 NOTE — Progress Notes (Signed)
S: Ctx unchanged, coping well but getting tired. Epidural effective.  O: VSSAF FHR 130's, mod var, + accels, no decels Ctx q 3-5 min, mild/mod to palp  SVE 6/70/-1 AROM light mec  A/P: IOL at term, s/p cytotec buccal x 3 and cervical balloon FHT cat 1 GBS prophylaxis ongoinng  Will restart Pitocin augmentation, position changes to facilitate rotation.   [name], MSN, CNM 01/11/2018, 8:38 AM

## 2018-01-11 NOTE — Anesthesia Preprocedure Evaluation (Signed)
Anesthesia Evaluation  Patient identified by MRN, date of birth, ID band Patient awake    Reviewed: Allergy & Precautions, H&P , NPO status , Patient's Chart, lab work & pertinent test results  Airway Mallampati: I  TM Distance: >3 FB Neck ROM: full    Dental no notable dental hx. (+) Teeth Intact   Pulmonary neg pulmonary ROS,    Pulmonary exam normal breath sounds clear to auscultation       Cardiovascular Normal cardiovascular exam Rhythm:regular Rate:Normal     Neuro/Psych negative neurological ROS  negative psych ROS   GI/Hepatic negative GI ROS, Neg liver ROS,   Endo/Other  negative endocrine ROS  Renal/GU negative Renal ROS  negative genitourinary   Musculoskeletal negative musculoskeletal ROS (+)   Abdominal Normal abdominal exam  (+)   Peds  Hematology negative hematology ROS (+)   Anesthesia Other Findings   Reproductive/Obstetrics (+) Pregnancy                             Anesthesia Physical Anesthesia Plan  ASA: II  Anesthesia Plan: Epidural   Post-op Pain Management:    Induction:   PONV Risk Score and Plan:   Airway Management Planned:   Additional Equipment:   Intra-op Plan:   Post-operative Plan:   Informed Consent: I have reviewed the patients History and Physical, chart, labs and discussed the procedure including the risks, benefits and alternatives for the proposed anesthesia with the patient or authorized representative who has indicated his/her understanding and acceptance.     Plan Discussed with:   Anesthesia Plan Comments:         Anesthesia Quick Evaluation

## 2018-01-11 NOTE — Anesthesia Procedure Notes (Signed)
Epidural Patient location during procedure: OB Start time: 01/11/2018 12:34 AM End time: 01/11/2018 12:37 AM  Staffing Anesthesiologist: Lyn Hollingshead, MD Performed: anesthesiologist   Preanesthetic Checklist Completed: patient identified, site marked, surgical consent, pre-op evaluation, timeout performed, IV checked, risks and benefits discussed and monitors and equipment checked  Epidural Patient position: sitting Prep: site prepped and draped and DuraPrep Patient monitoring: continuous pulse ox and blood pressure Approach: midline Location: L3-L4 Injection technique: LOR air  Needle:  Needle type: Tuohy  Needle gauge: 17 G Needle length: 9 cm and 9 Needle insertion depth: 5 cm cm Catheter type: closed end flexible Catheter size: 19 Gauge Catheter at skin depth: 10 cm Test dose: negative and Other  Assessment Sensory level: T9 Events: blood not aspirated, injection not painful, no injection resistance, negative IV test and no paresthesia  Additional Notes Reason for block:procedure for pain

## 2018-01-12 LAB — CBC
HEMATOCRIT: 32 % — AB (ref 36.0–46.0)
HEMOGLOBIN: 10.4 g/dL — AB (ref 12.0–15.0)
MCH: 29.1 pg (ref 26.0–34.0)
MCHC: 32.5 g/dL (ref 30.0–36.0)
MCV: 89.6 fL (ref 78.0–100.0)
PLATELETS: 120 10*3/uL — AB (ref 150–400)
RBC: 3.57 MIL/uL — AB (ref 3.87–5.11)
RDW: 14.4 % (ref 11.5–15.5)
WBC: 15.4 10*3/uL — ABNORMAL HIGH (ref 4.0–10.5)

## 2018-01-12 MED ORDER — METOPROLOL TARTRATE 50 MG PO TABS: 50.0000 mg | ORAL_TABLET | Freq: Every day | ORAL | 1 refills | Status: DC

## 2018-01-12 MED ORDER — ACETAMINOPHEN 325 MG PO TABS: 650.0000 mg | ORAL_TABLET | ORAL | Status: AC | PRN

## 2018-01-12 MED ORDER — IBUPROFEN 600 MG PO TABS: 600.0000 mg | ORAL_TABLET | Freq: Four times a day (QID) | ORAL | 0 refills | Status: DC

## 2018-01-12 MED ORDER — BACID PO TABS: 2.0000 | ORAL_TABLET | Freq: Three times a day (TID) | ORAL | Status: DC

## 2018-01-12 MED ORDER — SERTRALINE HCL 50 MG PO TABS: 50.0000 mg | ORAL_TABLET | Freq: Every day | ORAL | 2 refills | Status: DC

## 2018-01-12 MED ORDER — BENZOCAINE-MENTHOL 20-0.5 % EX AERO: 1.0000 "application " | INHALATION_SPRAY | CUTANEOUS | Status: DC | PRN

## 2018-01-12 MED ORDER — METOPROLOL TARTRATE 25 MG PO TABS: 25.0000 mg | ORAL_TABLET | Freq: Every morning | ORAL | 1 refills | Status: DC

## 2018-01-12 MED ORDER — COCONUT OIL OIL: 1.0000 "application " | TOPICAL_OIL | 0 refills | Status: DC | PRN

## 2018-01-12 MED ORDER — DIBUCAINE 1 % RE OINT: 1.0000 "application " | TOPICAL_OINTMENT | RECTAL | 0 refills | Status: DC | PRN

## 2018-01-12 NOTE — Anesthesia Postprocedure Evaluation (Signed)
Anesthesia Post Note  Patient: Pamela Walton  Procedure(s) Performed: AN AD Fisher     Patient location during evaluation: Mother Baby Anesthesia Type: Epidural Level of consciousness: awake and alert Pain management: pain level controlled Vital Signs Assessment: post-procedure vital signs reviewed and stable Respiratory status: spontaneous breathing Cardiovascular status: blood pressure returned to baseline and stable Postop Assessment: no headache, no backache, epidural receding, patient able to bend at knees, no apparent nausea or vomiting, able to ambulate and adequate PO intake Anesthetic complications: no    Last Vitals:  Vitals:   01/11/18 1715 01/12/18 0617  BP: 133/66 110/77  Pulse: 64 82  Resp: 16 18  Temp: 36.9 C 36.4 C  SpO2:      Last Pain:  Vitals:   01/12/18 0617  TempSrc: Oral  PainSc:    Pain Goal: Patients Stated Pain Goal: 0 (01/11/18 0402)               Roselynn Whitacre

## 2018-01-12 NOTE — Discharge Summary (Signed)
Obstetric Discharge Summary Reason for Admission: induction of labor and elective Prenatal Procedures: ultrasound Intrapartum Procedures: spontaneous vaginal delivery, GBS prophylaxis and epidural Postpartum Procedures: none Complications-Operative and Postpartum: 2nd degree perineal laceration Hemoglobin  Date Value Ref Range Status  01/12/2018 10.4 (L) 12.0 - 15.0 g/dL Final   HCT  Date Value Ref Range Status  01/12/2018 32.0 (L) 36.0 - 46.0 % Final    Physical Exam:  General: alert, cooperative and no distress Lochia: appropriate Uterine Fundus: firm Incision: healing well DVT Evaluation: No significant calf/ankle edema. Calf/Ankle edema is present.  Discharge Diagnoses: Term Pregnancy-delivered  Discharge Information: Date: 01/12/2018 Activity: pelvic rest Diet: routine Medications:  Allergies as of 01/12/2018   No Known Allergies     Medication List    STOP taking these medications   cyclobenzaprine 10 MG tablet Commonly known as:  FLEXERIL   fluconazole 150 MG tablet Commonly known as:  DIFLUCAN   hyoscyamine 0.125 MG SL tablet Commonly known as:  LEVSIN/SL   labetalol 100 MG tablet Commonly known as:  NORMODYNE   Magnesium 500 MG Tabs   omeprazole 40 MG capsule Commonly known as:  PRILOSEC   promethazine 25 MG suppository Commonly known as:  PHENERGAN   ranitidine 150 MG tablet Commonly known as:  ZANTAC   Vitamin D3 5000 units Caps     TAKE these medications   acetaminophen 325 MG tablet Commonly known as:  TYLENOL Take 2 tablets (650 mg total) by mouth every 4 (four) hours as needed (for pain scale < 4).   benzocaine-Menthol 20-0.5 % Aero Commonly known as:  DERMOPLAST Apply 1 application topically as needed for irritation (perineal discomfort).   coconut oil Oil Apply 1 application topically as needed.   dibucaine 1 % Oint Commonly known as:  NUPERCAINAL Place 1 application rectally as needed for hemorrhoids.   ibuprofen 600 MG  tablet Commonly known as:  ADVIL,MOTRIN Take 1 tablet (600 mg total) by mouth every 6 (six) hours.   lactobacillus acidophilus Tabs tablet Take 2 tablets by mouth 3 (three) times daily.   metoprolol tartrate 50 MG tablet Commonly known as:  LOPRESSOR Take 1 tablet (50 mg total) by mouth at bedtime. What changed:  when to take this   metoprolol tartrate 25 MG tablet Commonly known as:  LOPRESSOR Take 1 tablet (25 mg total) by mouth every morning. Start taking on:  01/13/2018 What changed:    medication strength  how much to take  how to take this  when to take this  additional instructions   sertraline 50 MG tablet Commonly known as:  ZOLOFT Take 1 tablet (50 mg total) by mouth daily. Take 25 mg (half tablet) daily x 1 week then increase to 50 mg (full tab) daily            Discharge Care Instructions  (From admission, onward)        Start     Ordered   01/12/18 0000  Discharge wound care:    Comments:  Sitz baths 2 times /day with warm water x 1 week   01/12/18 1211     Condition: stable Instructions: refer to practice specific booklet Discharge to: home Follow-up Information    Brien Few, MD. Schedule an appointment as soon as possible for a visit in 6 week(s).   Specialty:  Obstetrics and Gynecology Contact information: [address] [place] [address] [phone]           Newborn Data: Live born female Forestine [place]ute  Weight: 8 lb 6.6 oz (3815 g) APGAR: 8, 9  Newborn Delivery   Birth date/time:  01/11/2018 09:02:00 Delivery type:  Vaginal, Spontaneous     Home with mother.  [name], CNM 01/12/2018, 12:11 PM

## 2018-01-12 NOTE — Progress Notes (Addendum)
PPD # 1 SVD Information for the patient's newborn:  Pamela Walton, Pamela Walton [[id number]]  female      breast feeding Baby name: Pamela Walton:  Reports feeling well, desires early DC             Tolerating po/ No nausea or vomiting             Bleeding is light             Pain controlled with ibuprofen (OTC)             Up ad lib / ambulatory / voiding without difficulties   Reports hx of depression/anxiety, tx w/ Zoloft and effective. Discussed risk of PPD increased w/ past hs of depression.     O:  A & O x 3, in no apparent distress              VS:  Vitals:   01/11/18 1155 01/11/18 1316 01/11/18 1715 01/12/18 0617  BP: 121/74 128/74 133/66 110/77  Pulse: 88 97 64 82  Resp: 18 20 16 18   Temp: 98.7 F (37.1 C) 99 F (37.2 C) 98.5 F (36.9 C) 97.6 F (36.4 C)  TempSrc: Oral Oral Oral Oral  SpO2:      Weight:      Height:        LABS:  Recent Labs    01/10/18 0100 01/12/18 0549  WBC 14.7* 15.4*  HGB 11.7* 10.4*  HCT 35.0* 32.0*  PLT 174 120*    Blood type: --/--/A POS, A POS Performed at [facility], [address] [place],  [address]  (05/24 0100)  Rubella: Immune (10/16 0000)   I&O: I/O last 3 completed shifts: In: -  Out: 825 [Urine:550; Blood:275]          No intake/output data recorded.  Lungs: Clear and unlabored  Heart: regular rate and rhythm / no murmurs  Abdomen: soft, non-tender, non-distended             Fundus: firm, non-tender, U-1  Perineum: repair intact  Lochia: small  Extremities: +1 pedal edema, no calf pain or tenderness    A/P: PPD # 1 37 y.o., G1P1001   Principal Problem:   SVD 5/25 Active Problems:   Encounter for planned induction of labor   Postpartum care following vaginal delivery   Obstetrical laceration - 2nd deg perineal, R labial splay Hx depression, increased risk of PPD  - self care reviewed  - offered prophylactic SSRI and agrees  - start Zoloft 25 mg PO daily, increase to 50 mg after 1 week  - continue  SSRI at least x 6 months PP Hx Cardiac dysrhythmia stable on metoprolol  - continue medication as established   Doing well - stable status  Routine post partum orders             DC home today w/ instructions  F/U at [place] in 6 weeks and PRN     [name], MSN, CNM 01/12/2018, 11:10 AM

## 2018-01-13 ENCOUNTER — Ambulatory Visit: Payer: Self-pay

## 2018-01-13 NOTE — Lactation Note (Signed)
This note was copied from a baby's chart. Lactation Consultation Note  Patient Name: Pamela Walton SHFWY'O Date: 01/13/2018 Reason for consult: Follow-up assessment;Term;Primapara;1st time breastfeeding;Nipple pain/trauma  Baby is 27 hours old  LC reviewed and updated the doc flow sheets per mom  Per mom nipples are sore , LC assessed with mom's permission and noted the left nipple to  Have a positional strip and the right small reabsorbing blister.  LC recommended prior to feeding - breast massage , hand express, pre-pump with hand pump  To prime the milk ducts, with firm support, latch with breast compressions until swallows and comfort  Achieved.  Sore nipple and engorgement prevention and tx reviewed . Mom has hand pump , DEBP at home,  And DEBP kit she has been using in the hospital due to bili being elevated.  Mom and dad baby has not stool since birth. 6 voids.  Baby asleep and grandmother holding baby near sunny window.  Mother informed of post-discharge support and given phone number to the lactation department, including services for phone call assistance; out-patient appointments; and breastfeeding support group. List of other breastfeeding resources in the community given in the handout. Encouraged mother to call for problems or concerns related to breastfeeding.  LC enc mom to call for feeding assessment if needing help.    Maternal Data Has patient been taught Hand Expression?: Yes(LC reviewed and mom returned demo )  Feeding Feeding Type: (last fed at 10:10 for 22 mins ) Length of feed: 22 min(per mom )  LATCH Score Latch: Grasps breast easily, tongue down, lips flanged, rhythmical sucking.  Audible Swallowing: A few with stimulation  Type of Nipple: Everted at rest and after stimulation  Comfort (Breast/Nipple): Soft / non-tender  Hold (Positioning): No assistance needed to correctly position infant at breast.  LATCH Score:  9  Interventions Interventions: Breast feeding basics reviewed;Shells;Comfort gels;Hand pump;DEBP  Lactation Tools Discussed/Used Tools: Shells;Pump;Comfort gels Shell Type: Inverted Breast pump type: Double-Electric Breast Pump;Manual Pump Review: Setup, frequency, and cleaning;Milk Storage Initiated by:: LCreviewed / MAI    Consult Status Consult Status: Complete Date: 01/13/18    Jerlyn Ly Jessieca Rhem 01/13/2018, 11:22 AM

## 2018-01-14 ENCOUNTER — Ambulatory Visit: Payer: Self-pay

## 2018-01-14 NOTE — Lactation Note (Signed)
This note was copied from a baby's chart. Lactation Consultation Note: Mother had infant latched when I arrived in the room. Infant in cross cradle hold lying on her back. Mother was advised to rotate infant facing her breast and hug infant close. Mother was given support pillows for her back. Advised in the importance of using good firm support. Assist mother with flanging infants lips for wider gape. Mother taught to do breast compression. Observed burst of rhythmic suckling and swallows. Mother was given comfort gels and advised in use. Discussed tips to prevent sore nipples.  Mother advised to continue to cue base feed and feed infant at least 8-12 times in 24 hours. Mother advised to do frequent skin to skin. Discussed treatment and prevention of engorgement. Mother informed of all [facility] services, [facility]FS[facility], outpatient dept. And phone assistance. Mother receptive to all teaching. Mother to follow up with [facility] services as needed.  Patient Name: Pamela Walton [id number]'[id number] Date: 01/14/2018 Reason for consult: Follow-up assessment   Maternal Data    Feeding Feeding Type: Breast Fed Length of feed: 10 min  LATCH Score Latch: Grasps breast easily, tongue down, lips flanged, rhythmical sucking.  Audible Swallowing: Spontaneous and intermittent  Type of Nipple: Everted at rest and after stimulation  Comfort (Breast/Nipple): Filling, red/small blisters or bruises, mild/mod discomfort  Hold (Positioning): Assistance needed to correctly position infant at breast and maintain latch.  LATCH Score: 8  Interventions Interventions: Assisted with latch;Breast massage;Hand express;Breast compression;Adjust position;Support pillows;Position options;Expressed milk;Comfort gels  Lactation Tools Discussed/Used     Consult Status Consult Status: Complete    Darla Lesches 01/14/2018, 11:42 AM

## 2018-01-14 NOTE — Lactation Note (Signed)
This note was copied from a baby's chart. Lactation Consultation Note  Patient Name: Pamela Walton HYQMV'H Date: 01/14/2018    RN called for [facility] follow up visit:  Mother holding infant STS as I arrived and baby sleeping.  Mother stated that breastfeeding is going well and she has no questions/concerns.  Her milk has come in and she feels fuller.  Nipples are intact and she is pre pumping and doing hand expression after feedings.  Engorgement prevention/treatment discussed.  Mother is hoping to be discharged today.  Reminded her about our OP services if she needs to call or visit.  Mother verbalized understanding and will call for help as needed.               Jabin Tapp R Amear Strojny 01/14/2018, 3:17 AM

## 2018-02-06 ENCOUNTER — Other Ambulatory Visit: Payer: Self-pay | Admitting: Internal Medicine

## 2018-02-06 MED ORDER — CEPHALEXIN 500 MG PO CAPS: ORAL_CAPSULE | ORAL | 3 refills | Status: DC

## 2018-02-07 ENCOUNTER — Other Ambulatory Visit: Payer: Self-pay | Admitting: Adult Health

## 2018-02-07 MED ORDER — HYOSCYAMINE SULFATE 0.125 MG PO TABS
0.1250 mg | ORAL_TABLET | ORAL | 1 refills | Status: DC | PRN
Start: 2018-02-07 — End: 2022-05-15

## 2018-04-28 ENCOUNTER — Other Ambulatory Visit: Payer: Self-pay | Admitting: Internal Medicine

## 2018-04-28 DIAGNOSIS — R7309 Other abnormal glucose: Secondary | ICD-10-CM

## 2018-04-28 DIAGNOSIS — Z79899 Other long term (current) drug therapy: Secondary | ICD-10-CM

## 2018-04-28 DIAGNOSIS — Z13220 Encounter for screening for lipoid disorders: Secondary | ICD-10-CM

## 2018-04-28 DIAGNOSIS — E559 Vitamin D deficiency, unspecified: Secondary | ICD-10-CM

## 2018-04-28 DIAGNOSIS — R5383 Other fatigue: Secondary | ICD-10-CM

## 2018-05-01 ENCOUNTER — Other Ambulatory Visit: Payer: 59

## 2018-05-01 DIAGNOSIS — E559 Vitamin D deficiency, unspecified: Secondary | ICD-10-CM

## 2018-05-01 DIAGNOSIS — R7309 Other abnormal glucose: Secondary | ICD-10-CM

## 2018-05-01 DIAGNOSIS — Z79899 Other long term (current) drug therapy: Secondary | ICD-10-CM

## 2018-05-01 DIAGNOSIS — Z13220 Encounter for screening for lipoid disorders: Secondary | ICD-10-CM

## 2018-05-01 DIAGNOSIS — R5383 Other fatigue: Secondary | ICD-10-CM

## 2018-05-02 LAB — LIPID PANEL
CHOLESTEROL: 165 mg/dL (ref <200)
HDL: 50 mg/dL — ABNORMAL LOW (ref >50)
LDL Cholesterol (Calc): 89 mg/dL (calc)
Non-HDL Cholesterol (Calc): 115 mg/dL (calc) (ref <130)
TRIGLYCERIDES: 164 mg/dL — AB (ref <150)
Total CHOL/HDL Ratio: 3.3 (calc) (ref <5.0)

## 2018-05-02 LAB — CBC WITH DIFFERENTIAL/PLATELET
BASOS PCT: 0.7 %
Basophils Absolute: 52 cells/uL (ref 0–200)
EOS PCT: 1.6 %
Eosinophils Absolute: 118 cells/uL (ref 15–500)
HCT: 38.7 % (ref 35.0–45.0)
HEMOGLOBIN: 13.3 g/dL (ref 11.7–15.5)
Lymphs Abs: 2679 cells/uL (ref 850–3900)
MCH: 29 pg (ref 27.0–33.0)
MCHC: 34.4 g/dL (ref 32.0–36.0)
MCV: 84.5 fL (ref 80.0–100.0)
MONOS PCT: 8.1 %
MPV: 11.1 fL (ref 7.5–12.5)
NEUTROS ABS: 3952 {cells}/uL (ref 1500–7800)
Neutrophils Relative %: 53.4 %
Platelets: 239 10*3/uL (ref 140–400)
RBC: 4.58 10*6/uL (ref 3.80–5.10)
RDW: 13.2 % (ref 11.0–15.0)
Total Lymphocyte: 36.2 %
WBC mixed population: 599 cells/uL (ref 200–950)
WBC: 7.4 10*3/uL (ref 3.8–10.8)

## 2018-05-02 LAB — COMPLETE METABOLIC PANEL WITH GFR
AG Ratio: 1.8 (calc) (ref 1.0–2.5)
ALBUMIN MSPROF: 4.4 g/dL (ref 3.6–5.1)
ALT: 21 U/L (ref 6–29)
AST: 20 U/L (ref 10–30)
Alkaline phosphatase (APISO): 81 U/L (ref 33–115)
BILIRUBIN TOTAL: 0.3 mg/dL (ref 0.2–1.2)
BUN: 16 mg/dL (ref 7–25)
CALCIUM: 9.2 mg/dL (ref 8.6–10.2)
CHLORIDE: 104 mmol/L (ref 98–110)
CO2: 28 mmol/L (ref 20–32)
CREATININE: 0.85 mg/dL (ref 0.50–1.10)
GFR, Est African American: 103 mL/min/{1.73_m2} (ref >=60)
GFR, Est Non African American: 89 mL/min/{1.73_m2} (ref >=60)
GLUCOSE: 89 mg/dL (ref 65–99)
Globulin: 2.5 g/dL (calc) (ref 1.9–3.7)
Potassium: 3.9 mmol/L (ref 3.5–5.3)
Sodium: 140 mmol/L (ref 135–146)
TOTAL PROTEIN: 6.9 g/dL (ref 6.1–8.1)

## 2018-05-02 LAB — HEMOGLOBIN A1C
HEMOGLOBIN A1C: 5.1 %{Hb} (ref <5.7)
Mean Plasma Glucose: 100 (calc)
eAG (mmol/L): 5.5 (calc)

## 2018-05-02 LAB — VITAMIN B12: VITAMIN B 12: 401 pg/mL (ref 200–1100)

## 2018-05-02 LAB — INSULIN, RANDOM: Insulin: 18 u[IU]/mL (ref 2.0–19.6)

## 2018-05-02 LAB — IRON, TOTAL/TOTAL IRON BINDING CAP
%SAT: 16 % (ref 16–45)
Iron: 61 ug/dL (ref 40–190)
TIBC: 383 ug/dL (ref 250–450)

## 2018-05-02 LAB — MAGNESIUM: Magnesium: 2 mg/dL (ref 1.5–2.5)

## 2018-05-02 LAB — VITAMIN D 25 HYDROXY (VIT D DEFICIENCY, FRACTURES): Vit D, 25-Hydroxy: 35 ng/mL (ref 30–100)

## 2018-05-02 LAB — TSH: TSH: 1.09 m[IU]/L

## 2018-05-27 ENCOUNTER — Other Ambulatory Visit: Payer: Self-pay

## 2018-06-03 ENCOUNTER — Telehealth: Payer: Self-pay | Admitting: Internal Medicine

## 2018-06-03 NOTE — Telephone Encounter (Signed)
Patient requesting [employer] form

## 2018-07-11 ENCOUNTER — Other Ambulatory Visit: Payer: Self-pay | Admitting: Adult Health

## 2018-07-11 MED ORDER — PREDNISONE 20 MG PO TABS: ORAL_TABLET | ORAL | 0 refills | Status: DC

## 2018-07-11 NOTE — Progress Notes (Signed)
Patient called to report increasing sinus pressure over this last week, culminating in sinus headache today, requesting prednisone taper. This was sent in to pharmacy as requested.

## 2018-07-13 ENCOUNTER — Other Ambulatory Visit: Payer: Self-pay | Admitting: Internal Medicine

## 2018-07-13 MED ORDER — AZITHROMYCIN 250 MG PO TABS: ORAL_TABLET | ORAL | 1 refills | Status: DC

## 2018-07-13 MED ORDER — PREDNISONE 20 MG PO TABS: ORAL_TABLET | ORAL | 1 refills | Status: DC

## 2018-10-10 ENCOUNTER — Other Ambulatory Visit: Payer: Self-pay | Admitting: Internal Medicine

## 2018-10-10 MED ORDER — METOPROLOL TARTRATE 50 MG PO TABS: ORAL_TABLET | ORAL | 3 refills | Status: DC

## 2018-10-28 ENCOUNTER — Telehealth: Payer: Self-pay | Admitting: Internal Medicine

## 2018-10-28 NOTE — Telephone Encounter (Signed)
TELEPHONE TEST

## 2018-11-02 ENCOUNTER — Other Ambulatory Visit: Payer: Self-pay | Admitting: Internal Medicine

## 2018-11-02 MED ORDER — HYDROXYCHLOROQUINE SULFATE 200 MG PO TABS: ORAL_TABLET | ORAL | 1 refills | Status: DC

## 2018-12-10 ENCOUNTER — Other Ambulatory Visit: Payer: Self-pay | Admitting: Internal Medicine

## 2018-12-10 ENCOUNTER — Other Ambulatory Visit: Payer: 59

## 2018-12-10 ENCOUNTER — Other Ambulatory Visit: Payer: Self-pay

## 2018-12-10 DIAGNOSIS — E611 Iron deficiency: Secondary | ICD-10-CM

## 2018-12-10 DIAGNOSIS — Z20822 Contact with and (suspected) exposure to covid-19: Secondary | ICD-10-CM

## 2018-12-10 DIAGNOSIS — E8881 Metabolic syndrome: Secondary | ICD-10-CM

## 2018-12-10 DIAGNOSIS — Z20828 Contact with and (suspected) exposure to other viral communicable diseases: Secondary | ICD-10-CM

## 2018-12-10 DIAGNOSIS — R5383 Other fatigue: Secondary | ICD-10-CM

## 2018-12-10 DIAGNOSIS — E559 Vitamin D deficiency, unspecified: Secondary | ICD-10-CM

## 2018-12-10 DIAGNOSIS — E538 Deficiency of other specified B group vitamins: Secondary | ICD-10-CM

## 2018-12-12 LAB — CBC WITH DIFFERENTIAL/PLATELET
Absolute Monocytes: 571 cells/uL (ref 200–950)
Basophils Absolute: 61 cells/uL (ref 0–200)
Basophils Relative: 0.9 %
Eosinophils Absolute: 82 cells/uL (ref 15–500)
Eosinophils Relative: 1.2 %
HCT: 42 % (ref 35.0–45.0)
Hemoglobin: 14.1 g/dL (ref 11.7–15.5)
Lymphs Abs: 2271 cells/uL (ref 850–3900)
MCH: 29 pg (ref 27.0–33.0)
MCHC: 33.6 g/dL (ref 32.0–36.0)
MCV: 86.4 fL (ref 80.0–100.0)
MPV: 10.6 fL (ref 7.5–12.5)
Monocytes Relative: 8.4 %
Neutro Abs: 3815 cells/uL (ref 1500–7800)
Neutrophils Relative %: 56.1 %
Platelets: 244 10*3/uL (ref 140–400)
RBC: 4.86 10*6/uL (ref 3.80–5.10)
RDW: 12.7 % (ref 11.0–15.0)
Total Lymphocyte: 33.4 %
WBC: 6.8 10*3/uL (ref 3.8–10.8)

## 2018-12-12 LAB — COMPLETE METABOLIC PANEL WITH GFR
AG Ratio: 1.9 (calc) (ref 1.0–2.5)
ALT: 14 U/L (ref 6–29)
AST: 18 U/L (ref 10–30)
Albumin: 4.4 g/dL (ref 3.6–5.1)
Alkaline phosphatase (APISO): 55 U/L (ref 31–125)
BUN: 14 mg/dL (ref 7–25)
CO2: 25 mmol/L (ref 20–32)
Calcium: 9 mg/dL (ref 8.6–10.2)
Chloride: 104 mmol/L (ref 98–110)
Creat: 0.68 mg/dL (ref 0.50–1.10)
GFR, Est African American: 131 mL/min/{1.73_m2} (ref >=60)
GFR, Est Non African American: 113 mL/min/{1.73_m2} (ref >=60)
Globulin: 2.3 g/dL (calc) (ref 1.9–3.7)
Glucose, Bld: 83 mg/dL (ref 65–99)
Potassium: 4.3 mmol/L (ref 3.5–5.3)
Sodium: 138 mmol/L (ref 135–146)
Total Bilirubin: 0.4 mg/dL (ref 0.2–1.2)
Total Protein: 6.7 g/dL (ref 6.1–8.1)

## 2018-12-12 LAB — HEMOGLOBIN A1C
Hgb A1c MFr Bld: 5.3 % of total Hgb (ref <5.7)
Mean Plasma Glucose: 105 (calc)
eAG (mmol/L): 5.8 (calc)

## 2018-12-12 LAB — IRON, TOTAL/TOTAL IRON BINDING CAP
%SAT: 20 % (calc) (ref 16–45)
Iron: 73 ug/dL (ref 40–190)
TIBC: 369 mcg/dL (calc) (ref 250–450)

## 2018-12-12 LAB — VITAMIN D 25 HYDROXY (VIT D DEFICIENCY, FRACTURES): Vit D, 25-Hydroxy: 78 ng/mL (ref 30–100)

## 2018-12-12 LAB — VITAMIN B12: Vitamin B-12: 661 pg/mL (ref 200–1100)

## 2018-12-12 LAB — SAR COV2 SEROLOGY (COVID19)AB(IGG),IA: SARS CoV2 AB IGG: NEGATIVE

## 2018-12-12 LAB — TSH: TSH: 1.19 mIU/L

## 2018-12-12 LAB — INSULIN, RANDOM: Insulin: 6 u[IU]/mL

## 2019-01-15 ENCOUNTER — Other Ambulatory Visit: Payer: Self-pay

## 2019-01-15 ENCOUNTER — Encounter: Payer: Self-pay | Admitting: Adult Health

## 2019-01-15 ENCOUNTER — Ambulatory Visit (INDEPENDENT_AMBULATORY_CARE_PROVIDER_SITE_OTHER): Payer: 59 | Admitting: Adult Health

## 2019-01-15 VITALS — BP 120/72 | HR 98 | Temp 97.8°F

## 2019-01-15 DIAGNOSIS — R000 Tachycardia, unspecified: Secondary | ICD-10-CM | POA: Diagnosis not present

## 2019-01-15 DIAGNOSIS — M542 Cervicalgia: Secondary | ICD-10-CM | POA: Diagnosis not present

## 2019-01-15 DIAGNOSIS — M25519 Pain in unspecified shoulder: Secondary | ICD-10-CM | POA: Diagnosis not present

## 2019-01-15 MED ORDER — METOPROLOL TARTRATE 50 MG PO TABS: ORAL_TABLET | ORAL | 3 refills | Status: DC

## 2019-01-15 MED ORDER — DEXAMETHASONE SODIUM PHOSPHATE 10 MG/ML IJ SOLN
10.0000 mg | Freq: Once | INTRAMUSCULAR | Status: AC
  Administered 2019-01-15: 10 mg via INTRAMUSCULAR

## 2019-01-15 NOTE — Progress Notes (Signed)
Assessment and Plan:  Diagnoses and all orders for this visit:  Neck and shoulder pain/ Trigger point of neck 1 cc decadron and 1 cc 1% lidocaine administered to area of greatest tenderness after prep with alcohol With immediate improvement in symptoms, no complications Sterile bandage applied; monitor; follow up PRN -     dexamethasone (DECADRON) injection 10 mg  Tachycardia Was off BB due to pregnancy last year; restart due to evening tachycardia/racing heart interfering with sleep. Denies CP, dizziness, dyspnea.   Further disposition pending results of labs. Discussed med's effects and SE's.   Over 15 minutes of exam, counseling, chart review, and critical decision making was performed.   No future appointments.  ------------------------------------------------------------------------------------------------------------------   HPI BP 120/72   Pulse 98   Temp 97.8 F (36.6 C)   SpO2 99%   37 y.o.female with history of neck pain with R radicular symptoms intermittently persisting for 5+ year presents for Trigger point injection of R trap due to spasms and muscular tightness. 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 She normally gets massages to help manage but has been unable to do so due to covid 19.   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  The cord is otherwise normal.  Past Medical History:  Diagnosis Date  . Dysrhythmia    svt     No Known Allergies  Current Outpatient Medications on File Prior to Visit  Medication Sig  . acetaminophen (TYLENOL) 325 MG tablet Take 2 tablets (650 mg total) by mouth every 4 (four) hours as needed (for pain scale < 4).  . hyoscyamine (LEVSIN) 0.125 MG tablet Take 1 tablet (0.125 mg total) by mouth every 4 (four) hours as needed for cramping (diarrhea, nausea).  . metoprolol tartrate (LOPRESSOR) 50 MG tablet Take 1 tablet 2 x /day as  directed   No current facility-administered medications on file prior to visit.     ROS: all negative except above.   Physical Exam:  BP 120/72   Pulse 98   Temp 97.8 F (36.6 C)   SpO2 99%   General Appearance: Well nourished, in no apparent distress. Eyes: conjunctiva no swelling or erythema ENT/Mouth: Hearing normal.  Neck: Supple Respiratory: Respiratory effort normal, BS equal bilaterally without rales, rhonchi, wheezing or stridor.  Cardio: RRR with no MRGs. Brisk peripheral pulses without edema.  Lymphatics: Non tender without lymphadenopathy.  Musculoskeletal: Full ROM, 5/5 strength, normal gait. She has localized tenderness, muscular tightness through R trap and paraspinals.  Skin: Warm, dry without rashes, lesions, ecchymosis.  Neuro: Normal muscle tone, no cerebellar symptoms.  Psych: Awake and oriented X 3, normal affect, Insight and Judgment appropriate.     Izora Ribas, NP 1:28 PM [facility]

## 2019-03-23 ENCOUNTER — Telehealth: Payer: Self-pay | Admitting: Adult Health

## 2019-03-23 DIAGNOSIS — M5441 Lumbago with sciatica, right side: Secondary | ICD-10-CM

## 2019-03-23 NOTE — Telephone Encounter (Signed)
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 Denies hx of trauma or specific event prior to onset. She has new daughter and has been lifting and holding her at home and feels this contributes. Denies weakness of LE, foot drop, fever/chills, loss of bowel or bladder control. She has received local steroid injection without notable improvement and requesting PT referral which will be provided today.

## 2019-06-04 ENCOUNTER — Ambulatory Visit: Payer: 59

## 2019-06-04 ENCOUNTER — Other Ambulatory Visit: Payer: Self-pay

## 2019-06-12 ENCOUNTER — Other Ambulatory Visit: Payer: Self-pay | Admitting: Internal Medicine

## 2019-06-12 ENCOUNTER — Other Ambulatory Visit: Payer: Self-pay

## 2019-06-12 DIAGNOSIS — Z20822 Contact with and (suspected) exposure to covid-19: Secondary | ICD-10-CM

## 2019-06-12 MED ORDER — CYCLOBENZAPRINE HCL 10 MG PO TABS: ORAL_TABLET | ORAL | 0 refills | Status: DC

## 2019-06-12 MED ORDER — AZITHROMYCIN 250 MG PO TABS: ORAL_TABLET | ORAL | 1 refills | Status: DC

## 2019-06-12 MED ORDER — DEXAMETHASONE 4 MG PO TABS: ORAL_TABLET | ORAL | 1 refills | Status: DC

## 2019-06-13 LAB — NOVEL CORONAVIRUS, NAA: SARS-CoV-2, NAA: NOT DETECTED

## 2019-06-15 ENCOUNTER — Other Ambulatory Visit: Payer: Self-pay | Admitting: Internal Medicine

## 2019-06-15 MED ORDER — BENZONATATE 200 MG PO CAPS: ORAL_CAPSULE | ORAL | 1 refills | Status: DC

## 2019-08-07 ENCOUNTER — Other Ambulatory Visit: Payer: Self-pay

## 2019-08-07 ENCOUNTER — Other Ambulatory Visit: Payer: Self-pay | Admitting: Adult Health

## 2019-08-07 ENCOUNTER — Other Ambulatory Visit: Payer: 59

## 2019-08-07 DIAGNOSIS — R109 Unspecified abdominal pain: Secondary | ICD-10-CM

## 2019-08-08 LAB — URINALYSIS W MICROSCOPIC + REFLEX CULTURE
Bacteria, UA: NONE SEEN /HPF
Bilirubin Urine: NEGATIVE
Glucose, UA: NEGATIVE
Hgb urine dipstick: NEGATIVE
Hyaline Cast: NONE SEEN /LPF
Ketones, ur: NEGATIVE
Leukocyte Esterase: NEGATIVE
Nitrites, Initial: NEGATIVE
Protein, ur: NEGATIVE
RBC / HPF: NONE SEEN /HPF (ref 0–2)
Specific Gravity, Urine: 1.004 (ref 1.001–1.03)
Squamous Epithelial / HPF: NONE SEEN /HPF (ref <=5)
WBC, UA: NONE SEEN /HPF (ref 0–5)
pH: 8 (ref 5.0–8.0)

## 2019-08-08 LAB — NO CULTURE INDICATED

## 2019-09-18 ENCOUNTER — Ambulatory Visit: Payer: 59 | Attending: Internal Medicine

## 2019-09-18 DIAGNOSIS — Z20822 Contact with and (suspected) exposure to covid-19: Secondary | ICD-10-CM

## 2019-09-19 LAB — NOVEL CORONAVIRUS, NAA: SARS-CoV-2, NAA: NOT DETECTED

## 2019-09-21 ENCOUNTER — Other Ambulatory Visit: Payer: Self-pay | Admitting: Internal Medicine

## 2019-09-21 MED ORDER — BENZONATATE 200 MG PO CAPS: ORAL_CAPSULE | ORAL | 1 refills | Status: DC

## 2019-10-05 ENCOUNTER — Encounter: Payer: Self-pay | Admitting: Internal Medicine

## 2019-10-07 ENCOUNTER — Encounter: Payer: Self-pay | Admitting: Internal Medicine

## 2019-10-14 ENCOUNTER — Other Ambulatory Visit: Payer: Self-pay | Admitting: Adult Health

## 2019-10-14 MED ORDER — AZITHROMYCIN 250 MG PO TABS: ORAL_TABLET | ORAL | 1 refills | Status: DC

## 2019-10-14 MED ORDER — BENZONATATE 200 MG PO CAPS: ORAL_CAPSULE | ORAL | 1 refills | Status: DC

## 2019-10-16 ENCOUNTER — Other Ambulatory Visit: Payer: Self-pay | Admitting: Adult Health

## 2019-10-16 MED ORDER — OMEPRAZOLE 40 MG PO CPDR: 40.0000 mg | DELAYED_RELEASE_CAPSULE | Freq: Every day | ORAL | 1 refills | Status: DC

## 2019-10-21 ENCOUNTER — Other Ambulatory Visit: Payer: Self-pay | Admitting: Adult Health

## 2019-10-21 DIAGNOSIS — R32 Unspecified urinary incontinence: Secondary | ICD-10-CM

## 2019-11-08 ENCOUNTER — Other Ambulatory Visit: Payer: Self-pay | Admitting: Internal Medicine

## 2019-11-08 DIAGNOSIS — S0083XA Contusion of other part of head, initial encounter: Secondary | ICD-10-CM

## 2019-11-10 ENCOUNTER — Encounter: Payer: Self-pay | Admitting: Physical Therapy

## 2019-11-10 ENCOUNTER — Other Ambulatory Visit: Payer: Self-pay

## 2019-11-10 ENCOUNTER — Ambulatory Visit: Payer: 59 | Attending: Adult Health | Admitting: Physical Therapy

## 2019-11-10 DIAGNOSIS — M6281 Muscle weakness (generalized): Secondary | ICD-10-CM | POA: Insufficient documentation

## 2019-11-10 DIAGNOSIS — R279 Unspecified lack of coordination: Secondary | ICD-10-CM | POA: Diagnosis present

## 2019-11-10 NOTE — Therapy (Addendum)
[facility] [address] [place], [place], [address] Phone: [phone]   Fax:  [phone]  Physical Therapy Evaluation  Patient Details  Name: Pamela Walton MRN: [id number] Date of Birth: October 10, 1980 Referring Provider (PT): Liane Comber, NP   Encounter Date: 11/10/2019  PT End of Session - 11/10/19 1655    Visit Number  1    Date for PT Re-Evaluation  02/02/20    PT Start Time  1017    PT Stop Time  1057    PT Time Calculation (min)  40 min    Activity Tolerance  Patient tolerated treatment well    Behavior During Therapy  [facility] for tasks assessed/performed       Past Medical History:  Diagnosis Date  . Dysrhythmia    svt    Past Surgical History:  Procedure Laterality Date  . NO PAST SURGERIES      There were no vitals filed for this visit.   Subjective Assessment - 11/10/19 1020    Subjective  Pt reports she has been leaking when coughing and with more exercise.  Pt states her low back will hurt but ab exercises make it feel better.  Gets a pouch throughout the day.  Pt has a rectocele and intercourse can hurt and has to reposition.  Back pain gets up 6/10 when she picks up her daughter (2y/o - 30lb).    How long can you walk comfortably?  1-2 miles    Patient Stated Goals  be able to exercise correctly with pure barre and treadmill exercises; squats and walking/running    Currently in Pain?  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    Referring Provider (PT)  Liane Comber, NP    Prior Therapy  Yes      Precautions   Precautions  None      Restrictions   Weight Bearing Restrictions  No      Balance Screen   Has the patient fallen in the past 6 months  No      [place] residence    Living Arrangements  Spouse/significant other;Children   1 age 80 y/o      Prior Function   Level of Independence  Independent    Vocation  Full time employment    Biomedical scientist  PA for internal medicine      Cognition   Overall Cognitive Status  Within Functional Limits for tasks assessed      Posture/Postural Control   Posture/Postural Control  Postural limitations    Postural Limitations  Anterior pelvic tilt;Increased lumbar lordosis;Forward head      ROM / Strength   AROM / PROM / Strength  PROM;Strength      PROM   Overall PROM Comments  bilateral hip normal PROM      Strength   Overall Strength Comments  Lt adduction 4+/5; everything else 5/5      Flexibility   Soft Tissue Assessment /Muscle Length  yes    Hamstrings  Rt 70%; Lt 80%      Palpation   Palpation comment  lumbar and hamstrings tight      Ambulation/Gait   Gait Pattern  Within Functional Limits                Objective measurements  completed on examination: See above findings.    Pelvic Floor Special Questions - 11/11/19 0001    Prior Pelvic/Prostate Exam  Yes    Are you Pregnant or attempting pregnancy?  No    Diastasis Recti  2 fingers above and below umbilicus; able to to reduce to <1 finger    Currently Sexually Active  Yes    Is this Painful  --   sometimes - can reposition   Marinoff Scale  no problems    Urinary Leakage  Yes    Pad use  depends on how full bladder is- wear a pad if having a bad cough    Activities that cause leaking  Coughing;Sneezing    Urinary urgency  No    Falling out feeling (prolapse)  Yes    Activities that cause feeling of prolapse  throughout the day as getting tired    Perineal Body/Introitus   Descended    Prolapse  Posterior Wall    Pelvic Floor Internal Exam  pt identity confirmed and informed consent given to perform internal soft tissue assessment    Exam Type  Vaginal    Palpation  tight levators at posterior attachment    Strength  weak squeeze, no lift    Strength # of seconds  4    Tone  low tone of  bulbocavernosis       [facility] Adult PT Treatment/Exercise - 11/11/19 0001      Self-Care   Self-Care  Other Self-Care Comments    Other Self-Care Comments   intial HEP and toileting techniques             PT Education - 11/11/19 1231    Education Details  toileting techniques, Access Code: [email or web address]    Person(s) Educated  Patient    Methods  Explanation;Demonstration;Handout    Comprehension  Verbalized understanding       PT Short Term Goals - 11/10/19 1658      PT SHORT TERM GOAL #1   Title  ind with toilet technques    Time  6    Period  Weeks    Status  New    Target Date  12/22/19      PT SHORT TERM GOAL #2   Title  ind with intial HEP    Time  6    Period  Weeks    Status  New    Target Date  12/22/19        PT Long Term Goals - 11/10/19 1657      PT LONG TERM GOAL #1   Title  Pt will report no low back pain when lifting her daughter    Time  60    Period  Weeks    Status  New    Target Date  02/02/20      PT LONG TERM GOAL #2   Title  Pt will be able to exercise and walk without noticing increased abdominal bulge at night    Time  12    Period  Weeks    Status  New    Target Date  02/02/20      PT LONG TERM GOAL #3   Title  Pt will be able to get back to jogging short distances for keeping up with her daughter and getting back into running routine    Time  12    Period  Weeks    Status  New    Target Date  02/02/20  PT LONG TERM GOAL #4   Title  independent with advanced HEP    Time  12    Period  Weeks    Status  New    Target Date  02/02/20             Plan - 11/11/19 1222    Clinical Impression Statement  Pt presents to skilled PT due to 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  Pt gets abdominal bulging and low back pain. She has occasional leakage with stress.  Pt has diastasis of 2 finger width that reduces to <1 finger when she engages the TrA.  Pt has weakness of obliques and  difficutly getting ribcage to collapse.  Pt has pelvic floor weakness of circular contraction and demonstrates 2/5 MMT and 4 sec hold.  Pt has tension in levators, lumbar paraspinals and bilat hamstrings.  Pt has anterior pelvic tilt. She will benefit from skilled PT to address muscle imbalances and deficits as mentioned in order to return to full function and be able to get back to her normal exercise routine for health and quality of life.    Stability/Clinical Decision Making  Stable/Uncomplicated    Clinical Decision Making  Low    Rehab Potential  Excellent    PT Frequency  1x / week    PT Duration  12 weeks    PT Treatment/Interventions  ADLs/Self Care Home Management;Biofeedback;Moist Heat;Cryotherapy;Electrical Stimulation;Therapeutic activities;Therapeutic exercise;Neuromuscular re-education;Manual techniques;Dry needling;Taping;Patient/family education    PT Next Visit Plan  biofeedback next - pelvic floor with abominal activation; squat and lift technique; oblique strengthening    PT Home Exercise Plan  Access Code: [email or web address]    Consulted and Agree with Plan of Care  Patient       Patient will benefit from skilled therapeutic intervention in order to improve the following deficits and impairments:  Pain, Decreased strength, Postural dysfunction, Increased muscle spasms, Increased fascial restricitons  Visit Diagnosis: Muscle weakness (generalized)  Unspecified lack of coordination     Problem List Patient Active Problem List   Diagnosis Date Noted  . SVD 5/25 01/11/2018  . Postpartum care following vaginal delivery 01/11/2018  . Obstetrical laceration - 2nd deg perineal, R labial splay 01/11/2018  . Encounter for planned induction of labor 01/10/2018  . Fatigue 03/14/2017  . Insulin resistance 12/12/2015  . Screening, lipid 12/12/2015  . Vitamin D deficiency 03/29/2014    Jule Ser, PT 11/11/2019, 2:39 PM  [facility] [address], [place], [place], [address] Phone: [phone]   Fax:  [phone]  Name: Pamela Walton MRN: [id number] Date of Birth: Aug 05, 1981

## 2019-11-10 NOTE — Patient Instructions (Addendum)
Toileting Techniques for Bowel Movements    An Evacuation/Defecation Plan   Here are the 4 basic points:  1. Lean forward enough for your elbows to rest on your knees 2. Support your feet on the floor or use a low stool if your feet don't touch the floor  3. Push out your belly as if you have swallowed a beach ball--you should feel a widening of your waist. "Belly Big, Belly Hard" 4. Open and relax your pelvic floor muscles, rather than tightening around the anus  While you are sitting on the toilet pay attention to the following areas: . Jaw and mouth position- relaxed not clenched . Angle of your hips - leaning slightly forward . Whether your feet touch the ground or not - should be flat and supported . Arm placement - rest against your thighs . Spine position - flat back . Waist . Breathing - exhale as you push (like blowing up a balloon or try using other sounds such as ahhhh, shhhhh, ohhhh or grrrrrrr) . Belly - hard and tight as you push . Anus (opening of the anal canal) - relaxed and open as you push . Anus - Tighten and lift pulling the muscle back in after you are done or if taking a break  If you are not successful after 10-15 minutes, try again later.  Avoid negative self-talk about your toileting experience.   Read this for more details and ask your PT if you need suggestions for adjustments or limitations:  1) Sitting on the toilet  a) Make sure your feet are supported - flat on the floor or step stool b) Many people find it effective to lean forward or raise their knees.  Propping your feet on a step stool (squatty potty is a brand name) can help the muscles around the anus to relax  c) When you lean forward, place your forearms on your thighs for support  2) Relaxing a) Breathe deeply and slowly in through your nose and out through your mouth. b) To become aware of how to relax your muscles, contracting and releasing muscles can be helpful.  Pull your pelvic floor  muscles in tightly by using the image of holding back gas, or closing around the anus (visualize making a circle smaller) and lifting the anus up and in.  Then release the muscles and your anus should drop down and feel open. Repeat 5 times ending with the feeling of relaxation. c) Keep your pelvic floor muscles relaxed; let your belly bulge out. d) The digestive tract starts at the mouth and ends at the anal opening, so be sure to relax both ends of the tube.  Place your tongue on the roof of your mouth with your teeth separated.  This helps relax your mouth and will help to relax the anus at the same time.  3) Emptying (defecation) a) Keep your pelvic floor and sphincter relaxed, then bulge your anal muscles.  Make the anal opening wide.  b) Stick your belly out as if you have swallowed a beach ball. c) Make your belly wall hard using your belly muscles while continuing to breathe. Doing this makes it easier to open your anus. d) Breath out and give a grunt (or try using other sounds such as ahhhh, shhhhh, ohhhh or grrrrrrr). e)  Can also try to act as if you are blowing up a balloon as you push  4) Finishing a) As you finish your bowel movement, pull the pelvic floor muscles   up and in.  This will leave your anus in the proper place rather than remaining pushed out and down. If you leave your anus pushed out and down, it will start to feel as though that is normal and give you incorrect signals about needing to have a bowel movement. Access Code: [email or web address] URL: [email or web address] Date: 11/11/2019 Prepared by: Jari Favre  Exercises Supine Hamstring Stretch with Strap - 1 x daily - 7 x weekly - 3 reps - 1 sets - 30 sec hold

## 2019-11-11 NOTE — Addendum Note (Signed)
Addended by: Su Hoff on: 11/11/2019 02:41 PM   Modules accepted: Orders

## 2019-11-24 ENCOUNTER — Encounter: Payer: Self-pay | Admitting: Physical Therapy

## 2019-11-24 ENCOUNTER — Other Ambulatory Visit: Payer: Self-pay

## 2019-11-24 ENCOUNTER — Ambulatory Visit: Payer: 59 | Attending: Adult Health | Admitting: Physical Therapy

## 2019-11-24 DIAGNOSIS — M6281 Muscle weakness (generalized): Secondary | ICD-10-CM | POA: Diagnosis present

## 2019-11-24 DIAGNOSIS — R279 Unspecified lack of coordination: Secondary | ICD-10-CM | POA: Diagnosis present

## 2019-11-24 NOTE — Therapy (Signed)
[facility] [address], [place], [place], [address] Phone: [phone]   Fax:  [phone]  Physical Therapy Treatment  Patient Details  Name: Pamela Walton MRN: [id number] Date of Birth: 30-Jul-1981 Referring Provider (PT): Liane Comber, NP   Encounter Date: 11/24/2019  PT End of Session - 11/24/19 0843    Visit Number  2    Date for PT Re-Evaluation  02/02/20    PT Start Time  0843    PT Stop Time  0925    PT Time Calculation (min)  42 min    Activity Tolerance  Patient tolerated treatment well    Behavior During Therapy  [facility] for tasks assessed/performed       Past Medical History:  Diagnosis Date  . Dysrhythmia    svt    Past Surgical History:  Procedure Laterality Date  . NO PAST SURGERIES      There were no vitals filed for this visit.  Subjective Assessment - 11/24/19 0843    Subjective  Pt states the toilet techniques helped. Same on back pain and leakage.    Currently in Pain?  Yes    Pain Score  3     Pain Location  Back    Pain Orientation  Lower    Pain Descriptors / Indicators  Sore    Pain Type  Acute pain;Chronic pain    Pain Onset  More than a month ago    Pain Frequency  Intermittent    Aggravating Factors   like that this morning    Effect of Pain on Daily Activities  no    Multiple Pain Sites  No                       [facility] Adult PT Treatment/Exercise - 11/24/19 0001      Neuro Re-ed    Neuro Re-ed Details   resting; 3.35mV; max 29mV slow to activate quick flicks; 10 sec hold uses accessory muscles and muscle fatigue significant after 5 sec ave 96mV; 20 se hold 8 mV; post testing rest ; staggared stance with kegel both ways (easier with Lt foot fwd);       Lumbar Exercises: Supine   Ab Set  10 reps    Large Ball Oblique Isometric Limitations  ball presses; ball overhead; LE rolling - 20x             PT Education - 11/24/19 0947    Education Details   updated medbridge    Person(s) Educated  Patient    Methods  Explanation;Demonstration;Tactile cues;Verbal cues;Handout    Comprehension  Verbalized understanding;Returned demonstration       PT Short Term Goals - 11/10/19 1658      PT SHORT TERM GOAL #1   Title  ind with toilet technques    Time  6    Period  Weeks    Status  New    Target Date  12/22/19      PT SHORT TERM GOAL #2   Title  ind with intial HEP    Time  6    Period  Weeks    Status  New    Target Date  12/22/19        PT Long Term Goals - 11/10/19 1657      PT LONG TERM GOAL #1   Title  Pt will report no low back pain when lifting her daughter    Time  12    Period  Weeks    Status  New    Target Date  02/02/20      PT LONG TERM GOAL #2   Title  Pt will be able to exercise and walk without noticing increased abdominal bulge at night    Time  12    Period  Weeks    Status  New    Target Date  02/02/20      PT LONG TERM GOAL #3   Title  Pt will be able to get back to jogging short distances for keeping up with her daughter and getting back into running routine    Time  12    Period  Weeks    Status  New    Target Date  02/02/20      PT LONG TERM GOAL #4   Title  independent with advanced HEP    Time  12    Period  Weeks    Status  New    Target Date  02/02/20            Plan - 11/24/19 0947    Clinical Impression Statement  Pt was able to progress HEP.  Pt did well with biofeedback and was motivated by having a goal to reach.  Pt did well with core and pelvic exercises separated.  She has hard time with quick flicks and fatigues quickly when doing kegel exercises.    PT Treatment/Interventions  ADLs/Self Care Home Management;Biofeedback;Moist Heat;Cryotherapy;Electrical Stimulation;Therapeutic activities;Therapeutic exercise;Neuromuscular re-education;Manual techniques;Dry needling;Taping;Patient/family education    PT Next Visit Plan  biofeedback again if helpful - pelvic floor with  abominal activation; squat and lift technique; oblique strengthening    PT Home Exercise Plan  Access Code: [email or web address]    Consulted and Agree with Plan of Care  Patient       Patient will benefit from skilled therapeutic intervention in order to improve the following deficits and impairments:  Pain, Decreased strength, Postural dysfunction, Increased muscle spasms, Increased fascial restricitons  Visit Diagnosis: Unspecified lack of coordination  Muscle weakness (generalized)     Problem List Patient Active Problem List   Diagnosis Date Noted  . SVD 5/25 01/11/2018  . Postpartum care following vaginal delivery 01/11/2018  . Obstetrical laceration - 2nd deg perineal, R labial splay 01/11/2018  . Encounter for planned induction of labor 01/10/2018  . Fatigue 03/14/2017  . Insulin resistance 12/12/2015  . Screening, lipid 12/12/2015  . Vitamin D deficiency 03/29/2014    Jule Ser, PT 11/24/2019, 10:12 AM  [facility] [address], [place], [place], [address] Phone: [phone]   Fax:  [phone]  Name: Pamela Walton MRN: [id number] Date of Birth: Sep 13, 1980

## 2019-11-24 NOTE — Patient Instructions (Signed)
Update to [employer]

## 2019-11-24 NOTE — Therapy (Signed)
[facility] [address], [place], [place], [address] Phone: [phone]   Fax:  [phone]  Physical Therapy Treatment  Patient Details  Name: Pamela Walton MRN: [id number] Date of Birth: 1980-09-12 Referring Provider (PT): Liane Comber, NP   Encounter Date: 11/24/2019  PT End of Session - 11/24/19 0843    Visit Number  2    Date for PT Re-Evaluation  02/02/20    PT Start Time  0843    Activity Tolerance  Patient tolerated treatment well    Behavior During Therapy  [facility] for tasks assessed/performed       Past Medical History:  Diagnosis Date  . Dysrhythmia    svt    Past Surgical History:  Procedure Laterality Date  . NO PAST SURGERIES      There were no vitals filed for this visit.  Subjective Assessment - 11/24/19 0843    Subjective  Pt states the toilet techniques helped. Same on back pain and leakage.    Currently in Pain?  Yes    Pain Score  3     Pain Location  Back    Pain Orientation  Lower    Pain Descriptors / Indicators  Sore    Pain Type  Acute pain;Chronic pain    Pain Onset  More than a month ago    Pain Frequency  Intermittent    Aggravating Factors   like that this morning    Effect of Pain on Daily Activities  no    Multiple Pain Sites  No                       [facility] Adult PT Treatment/Exercise - 11/24/19 0001      Neuro Re-ed    Neuro Re-ed Details   resting; 3.18mV; max 31mV slow to activate quick flicks; 10 sec hold uses accessory muscles and muscle fatigue significant after 5 sec ave 18mV; 20 se hold 8 mV; post testing rest ; staggared stance with kegel both ways (easier with Lt foot fwd);       Lumbar Exercises: Supine   Ab Set  10 reps    Large Ball Oblique Isometric Limitations  ball presses; ball overhead; LE rolling - 20x             PT Education - 11/24/19 0947    Education Details  updated medbridge    Person(s) Educated  Patient    Methods   Explanation;Demonstration;Tactile cues;Verbal cues;Handout    Comprehension  Verbalized understanding;Returned demonstration       PT Short Term Goals - 11/10/19 1658      PT SHORT TERM GOAL #1   Title  ind with toilet technques    Time  6    Period  Weeks    Status  New    Target Date  12/22/19      PT SHORT TERM GOAL #2   Title  ind with intial HEP    Time  6    Period  Weeks    Status  New    Target Date  12/22/19        PT Long Term Goals - 11/10/19 1657      PT LONG TERM GOAL #1   Title  Pt will report no low back pain when lifting her daughter    Time  32    Period  Weeks    Status  New    Target Date  02/02/20      PT LONG TERM GOAL #2   Title  Pt will be able to exercise and walk without noticing increased abdominal bulge at night    Time  12    Period  Weeks    Status  New    Target Date  02/02/20      PT LONG TERM GOAL #3   Title  Pt will be able to get back to jogging short distances for keeping up with her daughter and getting back into running routine    Time  12    Period  Weeks    Status  New    Target Date  02/02/20      PT LONG TERM GOAL #4   Title  independent with advanced HEP    Time  12    Period  Weeks    Status  New    Target Date  02/02/20            Plan - 11/24/19 0947    Clinical Impression Statement  Pt was able to progress HEP.  Pt did well with biofeedback and was motivated by having a goal to reach.  Pt did well with core and pelvic exercises separated.  She has hard time with quick flicks and fatigues quickly when doing kegel exercises.    PT Treatment/Interventions  ADLs/Self Care Home Management;Biofeedback;Moist Heat;Cryotherapy;Electrical Stimulation;Therapeutic activities;Therapeutic exercise;Neuromuscular re-education;Manual techniques;Dry needling;Taping;Patient/family education    PT Next Visit Plan  biofeedback again if helpful - pelvic floor with abominal activation; squat and lift technique; oblique  strengthening    PT Home Exercise Plan  Access Code: [email or web address]    Consulted and Agree with Plan of Care  Patient       Patient will benefit from skilled therapeutic intervention in order to improve the following deficits and impairments:  Pain, Decreased strength, Postural dysfunction, Increased muscle spasms, Increased fascial restricitons  Visit Diagnosis: Unspecified lack of coordination  Muscle weakness (generalized)     Problem List Patient Active Problem List   Diagnosis Date Noted  . SVD 5/25 01/11/2018  . Postpartum care following vaginal delivery 01/11/2018  . Obstetrical laceration - 2nd deg perineal, R labial splay 01/11/2018  . Encounter for planned induction of labor 01/10/2018  . Fatigue 03/14/2017  . Insulin resistance 12/12/2015  . Screening, lipid 12/12/2015  . Vitamin D deficiency 03/29/2014    Jule Ser, PT 11/24/2019, 9:54 AM  [facility] [address] [place], [place], [address] Phone: [phone]   Fax:  [phone]  Name: Pamela Walton MRN: [id number] Date of Birth: 01-Sep-1980

## 2019-12-02 ENCOUNTER — Other Ambulatory Visit: Payer: Self-pay | Admitting: Adult Health

## 2019-12-02 MED ORDER — LIDOCAINE VISCOUS HCL 2 % MT SOLN: OROMUCOSAL | 0 refills | Status: DC

## 2019-12-02 MED ORDER — OMEPRAZOLE 40 MG PO CPDR: 40.0000 mg | DELAYED_RELEASE_CAPSULE | Freq: Every day | ORAL | 1 refills | Status: DC

## 2019-12-02 MED ORDER — VALACYCLOVIR HCL 1 G PO TABS: 1000.0000 mg | ORAL_TABLET | Freq: Three times a day (TID) | ORAL | 0 refills | Status: DC

## 2019-12-08 ENCOUNTER — Ambulatory Visit: Payer: 59 | Admitting: Physical Therapy

## 2019-12-08 ENCOUNTER — Other Ambulatory Visit: Payer: Self-pay

## 2019-12-08 DIAGNOSIS — R279 Unspecified lack of coordination: Secondary | ICD-10-CM

## 2019-12-08 DIAGNOSIS — M6281 Muscle weakness (generalized): Secondary | ICD-10-CM

## 2019-12-08 NOTE — Therapy (Addendum)
[facility] [address], [place], [place], [address] Phone: [phone]   Fax:  [phone]  Physical Therapy Treatment  Patient Details  Name: Pamela Walton MRN: [id number] Date of Birth: 1981-06-17 Referring Provider (PT): Liane Comber, NP   Encounter Date: 12/08/2019  PT End of Session - 12/08/19 0850    Visit Number  3    Date for PT Re-Evaluation  02/02/20    PT Start Time  9326    PT Stop Time  0927    PT Time Calculation (min)  40 min       Past Medical History:  Diagnosis Date  . Dysrhythmia    svt    Past Surgical History:  Procedure Laterality Date  . NO PAST SURGERIES      There were no vitals filed for this visit.  Subjective Assessment - 12/08/19 0851    Subjective  Pt states she has the biofeedback and it is coming out when using at times.                       [facility] Treatment/Exercise - 12/08/19 0001      Neuro Re-ed    Neuro Re-ed Details   biofeedback used with exercises - internal sensor       Lumbar Exercises: Stretches   Piriformis Stretch  Right;Left;1 rep;30 seconds    Other Lumbar Stretch Exercise  child's pose and cat cow - 3 x 30 sec      Lumbar Exercises: Standing   Lifting Limitations  hip hinge - with cane for TC    Other Standing Lumbar Exercises  wall slides with pelvic - tilt      Lumbar Exercises: Supine   Ab Set  10 reps    Bent Knee Raise  15 reps    Large Ball Oblique Isometric Limitations  depressing ribcage with exercises for isometric oblique activation             PT Education - 12/08/19 1011    Education Details  updated medbridge and sent app    Person(s) Educated  Patient    Methods  Explanation;Demonstration;Tactile cues;Verbal cues;Handout    Comprehension  Verbalized understanding;Returned demonstration       PT Short Term Goals - 11/10/19 1658      PT SHORT TERM GOAL #1   Title  ind with toilet technques     Time  6    Period  Weeks    Status  New    Target Date  12/22/19      PT SHORT TERM GOAL #2   Title  ind with intial HEP    Time  6    Period  Weeks    Status  New    Target Date  12/22/19        PT Long Term Goals - 11/10/19 1657      PT LONG TERM GOAL #1   Title  Pt will report no low back pain when lifting her daughter    Time  43    Period  Weeks    Status  New    Target Date  02/02/20      PT LONG TERM GOAL #2   Title  Pt will be able to exercise and walk without noticing increased abdominal bulge at night    Time  12    Period  Weeks    Status  New    Target  Date  02/02/20      PT LONG TERM GOAL #3   Title  Pt will be able to get back to jogging short distances for keeping up with her daughter and getting back into running routine    Time  12    Period  Weeks    Status  New    Target Date  02/02/20      PT LONG TERM GOAL #4   Title  independent with advanced HEP    Time  12    Period  Weeks    Status  New    Target Date  02/02/20            Plan - 12/08/19 1012    Clinical Impression Statement  Pt did well with TC and VC along with biofeedback in order to improved coordination of abdominal and pelvic floor muscles.  pt added squats and hip hinge for work on eccentric muscle activity.  Pt needed cues to activate her obliques.  pt will benefit from skilled PT to continue progress strength in funcitonal movements such as squats.    PT Treatment/Interventions  ADLs/Self Care Home Management;Biofeedback;Moist Heat;Cryotherapy;Electrical Stimulation;Therapeutic activities;Therapeutic exercise;Neuromuscular re-education;Manual techniques;Dry needling;Taping;Patient/family education    PT Next Visit Plan  biofeedback again if helpful - pelvic floor with abominal activation; squat and lift technique; oblique strengthening    PT Home Exercise Plan  Access Code: [email or web address]    Consulted and Agree with Plan of Care  Patient       Patient will benefit from  skilled therapeutic intervention in order to improve the following deficits and impairments:  Pain, Decreased strength, Postural dysfunction, Increased muscle spasms, Increased fascial restricitons  Visit Diagnosis: Unspecified lack of coordination  Muscle weakness (generalized)     Problem List Patient Active Problem List   Diagnosis Date Noted  . SVD 5/25 01/11/2018  . Postpartum care following vaginal delivery 01/11/2018  . Obstetrical laceration - 2nd deg perineal, R labial splay 01/11/2018  . Encounter for planned induction of labor 01/10/2018  . Fatigue 03/14/2017  . Insulin resistance 12/12/2015  . Screening, lipid 12/12/2015  . Vitamin D deficiency 03/29/2014    Jule Ser, PT 12/08/2019, 10:15 AM  [facility] [address] [place], [place], [address] Phone: [phone]   Fax:  [phone]  Name: Pamela Walton MRN: [id number] Date of Birth: 1981/01/09  PHYSICAL THERAPY DISCHARGE SUMMARY  Visits from Start of Care: 3 Current functional level related to goals / functional outcomes: See above goals   Remaining deficits: See above   Education / Equipment: HEP  Plan: Patient agrees to discharge.  Patient goals were not met. Patient is being discharged due to not returning since the last visit.  ?????     [employer], PT 02/16/20 11:32 AM

## 2019-12-18 ENCOUNTER — Other Ambulatory Visit: Payer: Self-pay | Admitting: Adult Health

## 2019-12-18 DIAGNOSIS — N6332 Unspecified lump in axillary tail of the left breast: Secondary | ICD-10-CM

## 2020-01-01 ENCOUNTER — Ambulatory Visit
Admission: RE | Admit: 2020-01-01 | Discharge: 2020-01-01 | Disposition: A | Discharge status: Home / Self Care | Payer: 59 | Source: Ambulatory Visit | Attending: Adult Health | Admitting: Adult Health

## 2020-01-01 ENCOUNTER — Other Ambulatory Visit: Payer: Self-pay | Admitting: Adult Health

## 2020-01-01 ENCOUNTER — Other Ambulatory Visit: Payer: Self-pay

## 2020-01-01 DIAGNOSIS — N6332 Unspecified lump in axillary tail of the left breast: Secondary | ICD-10-CM

## 2020-03-04 ENCOUNTER — Ambulatory Visit: Payer: 59 | Admitting: Internal Medicine

## 2020-03-04 ENCOUNTER — Encounter: Payer: Self-pay | Admitting: Internal Medicine

## 2020-03-04 ENCOUNTER — Other Ambulatory Visit: Payer: Self-pay

## 2020-03-04 DIAGNOSIS — R1010 Upper abdominal pain, unspecified: Secondary | ICD-10-CM

## 2020-03-04 DIAGNOSIS — E559 Vitamin D deficiency, unspecified: Secondary | ICD-10-CM

## 2020-03-04 DIAGNOSIS — Z79899 Other long term (current) drug therapy: Secondary | ICD-10-CM

## 2020-03-04 DIAGNOSIS — R5383 Other fatigue: Secondary | ICD-10-CM

## 2020-03-04 NOTE — Progress Notes (Signed)
      Patient has 48 hour hx/o the last 2 nights awakening ~ 3 am with upper abdominal discomfort, chill, N/V.   She took Prilosec, Pepcid,Zofran & Mylanta  W/o relief the 1st night.      The 2sd night (last night), she also awoke with similar sx's and in add'n had profuse watery diarrhea. Also has hx/o fatigue      Patient was examined by Liane Comber, DNP who reported abd soft with epigastric tenderness w/o guarding or rebound. And a Negative Murphy's sign. There was also mild non specific RLQ tenderness w/o guarding or rebound  Dx - Abd pain ? Viral Gastroenteritis   Patient deferred Abd U/S.  Labs - CBC/diff, CMET, Amylase / Lipase, TSH, Iron studies, Vit D

## 2020-03-05 ENCOUNTER — Other Ambulatory Visit: Payer: Self-pay | Admitting: Internal Medicine

## 2020-03-05 DIAGNOSIS — Z79899 Other long term (current) drug therapy: Secondary | ICD-10-CM

## 2020-03-05 DIAGNOSIS — R5383 Other fatigue: Secondary | ICD-10-CM

## 2020-03-05 DIAGNOSIS — R1010 Upper abdominal pain, unspecified: Secondary | ICD-10-CM

## 2020-03-05 LAB — COMPLETE METABOLIC PANEL WITH GFR
AG Ratio: 2 (calc) (ref 1.0–2.5)
ALT: 20 U/L (ref 6–29)
AST: 17 U/L (ref 10–30)
Albumin: 4.7 g/dL (ref 3.6–5.1)
Alkaline phosphatase (APISO): 39 U/L (ref 31–125)
BUN: 19 mg/dL (ref 7–25)
CO2: 26 mmol/L (ref 20–32)
Calcium: 9.3 mg/dL (ref 8.6–10.2)
Chloride: 104 mmol/L (ref 98–110)
Creat: 0.65 mg/dL (ref 0.50–1.10)
GFR, Est African American: 132 mL/min/{1.73_m2} (ref >=60)
GFR, Est Non African American: 114 mL/min/{1.73_m2} (ref >=60)
Globulin: 2.3 g/dL (calc) (ref 1.9–3.7)
Glucose, Bld: 84 mg/dL (ref 65–99)
Potassium: 4 mmol/L (ref 3.5–5.3)
Sodium: 139 mmol/L (ref 135–146)
Total Bilirubin: 0.3 mg/dL (ref 0.2–1.2)
Total Protein: 7 g/dL (ref 6.1–8.1)

## 2020-03-05 LAB — LIPASE: Lipase: 19 U/L (ref 7–60)

## 2020-03-05 LAB — TSH: TSH: 0.9 mIU/L

## 2020-03-05 LAB — IRON, TOTAL/TOTAL IRON BINDING CAP
%SAT: 29 % (calc) (ref 16–45)
Iron: 118 ug/dL (ref 40–190)
TIBC: 405 mcg/dL (calc) (ref 250–450)

## 2020-03-05 LAB — AMYLASE: Amylase: 36 U/L (ref 21–101)

## 2020-03-05 LAB — VITAMIN D 25 HYDROXY (VIT D DEFICIENCY, FRACTURES): Vit D, 25-Hydroxy: 83 ng/mL (ref 30–100)

## 2020-03-05 NOTE — Progress Notes (Signed)
===================================================  -    CMET, Amylase, Lipase, TSH - all perfectly Normal   - Iron panel Normal   - Vitamin   - CBC reordered for Monday

## 2020-03-10 ENCOUNTER — Other Ambulatory Visit: Payer: Self-pay | Admitting: *Deleted

## 2020-03-10 ENCOUNTER — Other Ambulatory Visit: Payer: Self-pay | Admitting: Internal Medicine

## 2020-03-10 DIAGNOSIS — A09 Infectious gastroenteritis and colitis, unspecified: Secondary | ICD-10-CM

## 2020-03-15 ENCOUNTER — Other Ambulatory Visit: Payer: Self-pay | Admitting: Internal Medicine

## 2020-03-15 DIAGNOSIS — A09 Infectious gastroenteritis and colitis, unspecified: Secondary | ICD-10-CM

## 2020-03-16 ENCOUNTER — Other Ambulatory Visit: Payer: Self-pay | Admitting: Internal Medicine

## 2020-03-16 DIAGNOSIS — R300 Dysuria: Secondary | ICD-10-CM

## 2020-03-16 DIAGNOSIS — K529 Noninfective gastroenteritis and colitis, unspecified: Secondary | ICD-10-CM

## 2020-03-16 DIAGNOSIS — R1084 Generalized abdominal pain: Secondary | ICD-10-CM

## 2020-03-16 NOTE — Progress Notes (Signed)
============================================================  -    CMET, Amylase & Lipase perfectly Normal   - U/A - Negative  - CBC shows Nl Hgb 14.7 mgm% and   Elevated WBC 13,600  with sl left shift with 78%  PMN's - looks bacterial  - U/C pending - but don't expect it to be (+)

## 2020-03-17 LAB — CBC WITH DIFFERENTIAL/PLATELET
Absolute Monocytes: 816 cells/uL (ref 200–950)
Basophils Absolute: 54 cells/uL (ref 0–200)
Basophils Relative: 0.4 %
Eosinophils Absolute: 109 cells/uL (ref 15–500)
Eosinophils Relative: 0.8 %
HCT: 43.7 % (ref 35.0–45.0)
Hemoglobin: 14.7 g/dL (ref 11.7–15.5)
Lymphs Abs: 1972 cells/uL (ref 850–3900)
MCH: 29.7 pg (ref 27.0–33.0)
MCHC: 33.6 g/dL (ref 32.0–36.0)
MCV: 88.3 fL (ref 80.0–100.0)
MPV: 10.5 fL (ref 7.5–12.5)
Monocytes Relative: 6 %
Neutro Abs: 10649 cells/uL — ABNORMAL HIGH (ref 1500–7800)
Neutrophils Relative %: 78.3 %
Platelets: 253 10*3/uL (ref 140–400)
RBC: 4.95 10*6/uL (ref 3.80–5.10)
RDW: 12.1 % (ref 11.0–15.0)
Total Lymphocyte: 14.5 %
WBC: 13.6 10*3/uL — ABNORMAL HIGH (ref 3.8–10.8)

## 2020-03-17 LAB — URINALYSIS, ROUTINE W REFLEX MICROSCOPIC
Bilirubin Urine: NEGATIVE
Glucose, UA: NEGATIVE
Hgb urine dipstick: NEGATIVE
Ketones, ur: NEGATIVE
Leukocytes,Ua: NEGATIVE
Nitrite: NEGATIVE
Protein, ur: NEGATIVE
Specific Gravity, Urine: 1.017 (ref 1.001–1.03)
pH: 8 (ref 5.0–8.0)

## 2020-03-17 LAB — URINE CULTURE
MICRO NUMBER:: 10759707
Result:: NO GROWTH
SPECIMEN QUALITY:: ADEQUATE

## 2020-03-17 LAB — COMPLETE METABOLIC PANEL WITH GFR
AG Ratio: 2.1 (calc) (ref 1.0–2.5)
ALT: 14 U/L (ref 6–29)
AST: 15 U/L (ref 10–30)
Albumin: 4.6 g/dL (ref 3.6–5.1)
Alkaline phosphatase (APISO): 40 U/L (ref 31–125)
BUN: 17 mg/dL (ref 7–25)
CO2: 28 mmol/L (ref 20–32)
Calcium: 9.2 mg/dL (ref 8.6–10.2)
Chloride: 101 mmol/L (ref 98–110)
Creat: 0.76 mg/dL (ref 0.50–1.10)
GFR, Est African American: 117 mL/min/{1.73_m2} (ref >=60)
GFR, Est Non African American: 101 mL/min/{1.73_m2} (ref >=60)
Globulin: 2.2 g/dL (calc) (ref 1.9–3.7)
Glucose, Bld: 95 mg/dL (ref 65–99)
Potassium: 4.2 mmol/L (ref 3.5–5.3)
Sodium: 136 mmol/L (ref 135–146)
Total Bilirubin: 0.7 mg/dL (ref 0.2–1.2)
Total Protein: 6.8 g/dL (ref 6.1–8.1)

## 2020-03-17 LAB — AMYLASE: Amylase: 31 U/L (ref 21–101)

## 2020-03-17 LAB — LIPASE: Lipase: 17 U/L (ref 7–60)

## 2020-03-17 NOTE — Progress Notes (Signed)
============================================================  -   U/C - No Growth   - Consider empiric Abx's ~ Cipro /Flagyl  - and / or GI consult for Capsule endoscopy

## 2020-03-18 ENCOUNTER — Other Ambulatory Visit: Payer: Self-pay | Admitting: Adult Health

## 2020-03-18 MED ORDER — OMEPRAZOLE 40 MG PO CPDR: 40.0000 mg | DELAYED_RELEASE_CAPSULE | Freq: Every day | ORAL | 1 refills | Status: DC

## 2020-03-18 MED ORDER — CIPROFLOXACIN HCL 500 MG PO TABS
500.0000 mg | ORAL_TABLET | Freq: Two times a day (BID) | ORAL | 0 refills | Status: AC
Start: 2020-03-18 — End: 2020-03-25

## 2020-03-18 MED ORDER — METRONIDAZOLE 500 MG PO TABS: 500.0000 mg | ORAL_TABLET | Freq: Three times a day (TID) | ORAL | 0 refills | Status: AC

## 2020-04-11 ENCOUNTER — Other Ambulatory Visit: Payer: Self-pay | Admitting: Internal Medicine

## 2020-04-11 DIAGNOSIS — Z20822 Contact with and (suspected) exposure to covid-19: Secondary | ICD-10-CM

## 2020-07-06 ENCOUNTER — Other Ambulatory Visit: Payer: Self-pay | Admitting: Adult Health

## 2020-07-06 MED ORDER — ONDANSETRON HCL 8 MG PO TABS
ORAL_TABLET | ORAL | 1 refills | Status: DC
Start: 2020-07-06 — End: 2022-05-15

## 2020-07-20 ENCOUNTER — Other Ambulatory Visit: Payer: Self-pay | Admitting: Adult Health

## 2020-07-20 DIAGNOSIS — M79672 Pain in left foot: Secondary | ICD-10-CM

## 2021-01-04 ENCOUNTER — Other Ambulatory Visit: Payer: Self-pay | Admitting: Physician Assistant

## 2021-01-04 DIAGNOSIS — R1011 Right upper quadrant pain: Secondary | ICD-10-CM

## 2021-01-12 ENCOUNTER — Other Ambulatory Visit: Payer: 59

## 2021-02-02 ENCOUNTER — Other Ambulatory Visit: Payer: Self-pay | Admitting: Gastroenterology

## 2021-02-02 DIAGNOSIS — R1011 Right upper quadrant pain: Secondary | ICD-10-CM

## 2021-02-02 DIAGNOSIS — R112 Nausea with vomiting, unspecified: Secondary | ICD-10-CM

## 2021-02-03 ENCOUNTER — Ambulatory Visit
Admission: RE | Admit: 2021-02-03 | Discharge: 2021-02-03 | Disposition: A | Discharge status: Home / Self Care | Payer: No Typology Code available for payment source | Source: Ambulatory Visit | Attending: Gastroenterology | Admitting: Gastroenterology

## 2021-02-03 DIAGNOSIS — R1011 Right upper quadrant pain: Secondary | ICD-10-CM

## 2021-02-03 DIAGNOSIS — R112 Nausea with vomiting, unspecified: Secondary | ICD-10-CM

## 2021-10-26 ENCOUNTER — Other Ambulatory Visit: Payer: Self-pay | Admitting: Internal Medicine

## 2021-10-26 MED ORDER — DEXAMETHASONE 4 MG PO TABS: ORAL_TABLET | ORAL | 0 refills | Status: DC

## 2021-10-26 MED ORDER — AZITHROMYCIN 250 MG PO TABS: ORAL_TABLET | ORAL | 1 refills | Status: DC

## 2021-10-26 MED ORDER — ACETAMINOPHEN-CODEINE #3 300-30 MG PO TABS: ORAL_TABLET | ORAL | 0 refills | Status: DC

## 2022-02-06 ENCOUNTER — Other Ambulatory Visit: Payer: Self-pay

## 2022-04-19 ENCOUNTER — Other Ambulatory Visit: Payer: Self-pay | Admitting: Nurse Practitioner

## 2022-04-19 ENCOUNTER — Other Ambulatory Visit: Payer: Self-pay | Admitting: Internal Medicine

## 2022-04-19 ENCOUNTER — Encounter: Payer: Self-pay | Admitting: Internal Medicine

## 2022-04-19 MED ORDER — AMOXICILLIN-POT CLAVULANATE 875-125 MG PO TABS: 1.0000 | ORAL_TABLET | Freq: Two times a day (BID) | ORAL | 0 refills | Status: DC

## 2022-04-19 MED ORDER — NITROFURANTOIN MONOHYD MACRO 100 MG PO CAPS: ORAL_CAPSULE | ORAL | 0 refills | Status: DC

## 2022-04-19 MED ORDER — AMOXICILLIN-POT CLAVULANATE 250-125 MG PO TABS
ORAL_TABLET | ORAL | 0 refills | Status: DC
Start: 2022-04-19 — End: 2022-04-19

## 2022-05-15 ENCOUNTER — Telehealth: Payer: No Typology Code available for payment source | Admitting: Nurse Practitioner

## 2022-05-15 ENCOUNTER — Other Ambulatory Visit: Payer: Self-pay | Admitting: Internal Medicine

## 2022-05-15 DIAGNOSIS — R197 Diarrhea, unspecified: Secondary | ICD-10-CM

## 2022-05-15 DIAGNOSIS — R110 Nausea: Secondary | ICD-10-CM

## 2022-05-15 MED ORDER — ONDANSETRON HCL 4 MG PO TABS: 4.0000 mg | ORAL_TABLET | Freq: Three times a day (TID) | ORAL | 0 refills | Status: DC | PRN

## 2022-05-15 MED ORDER — HYOSCYAMINE SULFATE 0.125 MG PO TABS: ORAL_TABLET | ORAL | 1 refills | Status: DC

## 2022-05-15 MED ORDER — ZOFRAN 8 MG PO TABS: ORAL_TABLET | ORAL | 1 refills | Status: AC

## 2022-05-15 NOTE — Progress Notes (Signed)
We are sorry that you are not feeling well.  Here is how we plan to help!  Based on what you have shared with me it looks like you have Acute Infectious Diarrhea.  Most cases of acute diarrhea are due to infections with virus and bacteria and are self-limited conditions lasting less than 14 days.  For your symptoms you may take Imodium 2 mg tablets that are over the counter at your local pharmacy. Take two tablet now and then one after each loose stool up to 6 a day.  Antibiotics are not needed for most people with diarrhea.  We do not routinely prescribe medications for diarrhea via e-visit. After reviewing your chart it appears you were on antibiotics in the past month and this may contribute to your episode. We would recommend zofran for nausea, a bland diet and an over the counter probiotic to help with your current symptoms. IF they progress you should be evaluated either through an urgent video visit or acute care visit or by your primary care.   We will prescribe:  Zofran 4 mg 1 tablet every 8 hours as needed for nausea and vomiting   HOME CARE We recommend changing your diet to help with your symptoms for the next few days. Drink plenty of fluids that contain water salt and sugar. Sports drinks such as Gatorade may help.  You may try broths, soups, bananas, applesauce, soft breads, mashed potatoes or crackers.  You are considered infectious for as long as the diarrhea continues. Hand washing or use of alcohol based hand sanitizers is recommend. It is best to stay out of work or school until your symptoms stop.   GET HELP RIGHT AWAY If you have dark yellow colored urine or do not pass urine frequently you should drink more fluids.   If your symptoms worsen  If you feel like you are going to pass out (faint) You have a new problem  MAKE SURE YOU  Understand these instructions. Will watch your condition. Will get help right away if you are not doing well or get worse.  Thank  you for choosing an e-visit.  Your e-visit answers were reviewed by a board certified advanced clinical practitioner to complete your personal care plan. Depending upon the condition, your plan could have included both over the counter or prescription medications.  Please review your pharmacy choice. Make sure the pharmacy is open so you can pick up prescription now. If there is a problem, you may contact your provider through [employer] and have the prescription routed to another pharmacy.  Your safety is important to [place]. If you have drug allergies check your prescription carefully.   For the next 24 hours you can use MyChart to ask questions about today's visit, request a non-urgent call back, or ask for a work or school excuse. You will get an email in the next two days asking about your experience. I hope that your e-visit has been valuable and will speed your recovery.   Meds ordered this encounter  Medications   ondansetron (ZOFRAN) 4 MG tablet    Sig: Take 1 tablet (4 mg total) by mouth every 8 (eight) hours as needed for nausea or vomiting.    Dispense:  20 tablet    Refill:  0     I spent approximately 5 minutes reviewing the patient's history, current symptoms and coordinating their plan of care today.

## 2022-07-24 ENCOUNTER — Ambulatory Visit (INDEPENDENT_AMBULATORY_CARE_PROVIDER_SITE_OTHER): Payer: 59 | Admitting: Internal Medicine

## 2022-07-24 ENCOUNTER — Encounter: Payer: Self-pay | Admitting: Internal Medicine

## 2022-07-24 VITALS — BP 120/70 | HR 105 | Temp 98.0°F | Resp 16 | Ht 67.0 in | Wt 164.0 lb

## 2022-07-24 DIAGNOSIS — R946 Abnormal results of thyroid function studies: Secondary | ICD-10-CM

## 2022-07-24 DIAGNOSIS — E559 Vitamin D deficiency, unspecified: Secondary | ICD-10-CM

## 2022-07-24 DIAGNOSIS — R5383 Other fatigue: Secondary | ICD-10-CM | POA: Diagnosis not present

## 2022-07-24 NOTE — Addendum Note (Signed)
Addended by: Unk Pinto on: 07/24/2022 08:50 PM   Modules accepted: Orders

## 2022-07-24 NOTE — Progress Notes (Addendum)
Future Appointments  Date Time Provider Department  07/24/2022 11:30 AM Unk Pinto, MD [facility]    History of Present Illness:       This very nice 41 y.o. MWF  with negative PMH presents with concerns of  possibly enlarged thyroid as speculated by one of her medical colleagues that her neck appeared full about her thyroid and she does admit tendency to fatiguability.         Patient has no hx/o elevated BPs and today's BP is normal .120/70. Patient has had no complaints of any cardiac type chest pain, palpitations, dyspnea / orthopnea / PND, dizziness, claudication, or dependent edema.       Lipids have been Normal in the past.   Lab Results  Component Value Date   CHOL 165 05/01/2018   HDL 50 (L) 05/01/2018   LDLCALC 89 05/01/2018   TRIG 164 (H) 05/01/2018   CHOLHDL 3.3 05/01/2018     Also, the patient has history of Normal glucoses in the past. She has had no symptoms of reactive hypoglycemia, diabetic polys, paresthesias or visual blurring.  Last A1c was Normal :  Lab Results  Component Value Date   HGBA1C 5.3 12/11/2018        Further, the patient also has history of Vitamin D Deficiency and supplements vitamin D . Last vitamin D was at goal :  Lab Results  Component Value Date   VD25OH 83 03/04/2020     Current Outpatient Medications on File Prior to Visit  Medication Sig   acetaminophen 325 MG tablet Take 2 tablets   every 4  hours as needed    ZOFRAN 8 MG tablet Take  1/2 to 1 tablet  TID as needed for  Nausea or Vomiting.     No Known Allergies    PMHx:  Negative for significant medical problems.    Immunization History  Administered Date(s) Administered   Influenza Inj Mdck Quad  06/10/2017   Influenza 05/12/2018, 06/02/2019   PFIZER-SARS-COV-2 Vacc 09/04/2019, 09/25/2019, 07/31/2020   Tdap 03/26/2017, 10/31/2017     Past Surgical History:  Procedure Laterality Date   NO PAST SURGERIES      FHx:    Reviewed /  unchanged  SHx:    Reviewed / unchanged    Systems Review:  Constitutional: Denies fever, chills, wt changes, headaches, insomnia, fatigue, night sweats, change in appetite. Eyes: Denies redness, blurred vision, diplopia, discharge, itchy, watery eyes.  ENT: Denies discharge, congestion, post nasal drip, epistaxis, sore throat, earache, hearing loss, dental pain, tinnitus, vertigo, sinus pain, snoring.  CV: Denies chest pain, palpitations, irregular heartbeat, syncope, dyspnea, diaphoresis, orthopnea, PND, claudication or edema. Respiratory: denies cough, dyspnea, DOE, pleurisy, hoarseness, laryngitis, wheezing.  Gastrointestinal: Denies dysphagia, odynophagia, heartburn, reflux, water brash, abdominal pain or cramps, nausea, vomiting, bloating, diarrhea, constipation, hematemesis, melena, hematochezia  or hemorrhoids. Genitourinary: Denies dysuria, frequency, urgency, nocturia, hesitancy, discharge, hematuria or flank pain. Musculoskeletal: Denies arthralgias, myalgias, stiffness, jt. swelling, pain, limping or strain/sprain.  Skin: Denies pruritus, rash, hives, warts, acne, eczema or change in skin lesion(s). Neuro: No weakness, tremor, incoordination, spasms, paresthesia or pain. Psychiatric: Denies confusion, memory loss or sensory loss. Endo: Denies change in weight, skin or hair change.  Heme/Lymph: No excessive bleeding, bruising or enlarged lymph nodes.  Physical Exam  BP 120/70   P 105   T 98 F   R 16   Ht '5\' 7"'$     Wt 164 lb  SpO2-  97%  BMI 25.69   Appears  well nourished, well groomed  and in no distress.  Eyes: PERRLA, EOMs, conjunctiva no swelling or erythema. Sinuses: No frontal/maxillary tenderness ENT/Mouth: EAC's clear, TM's nl w/o erythema, bulging. Nares clear w/o erythema, swelling, exudates. Oropharynx clear without erythema or exudates. Oral hygiene is good. Tongue normal, non obstructing. Hearing intact.  Neck: Supple. Thyroid area appears full w/o  definite palpable thyromegaly or palpable nodules. Car 2+/2+ without bruits, nodes or JVD. Chest: Respirations nl with BS clear & equal w/o rales, rhonchi, wheezing  Cor: Heart sounds normal w/ regular rate and rhythm without sig. murmurs, gallops, clicks or rubs. Peripheral pulses normal and equal  without edema.  Musculoskeletal: Full ROM all peripheral extremities, joint stability, 5/5 strength and normal gait.  Skin: Warm, dry without exposed rashes, lesions or ecchymosis apparent.  Neuro: Cranial nerves intact, reflexes equal bilaterally. Sensory-motor testing grossly intact. Tendon reflexes grossly intact.  Pysch: Alert & oriented x 3.  Insight and judgement nl & appropriate. No ideations.  Assessment and Plan:   1. Abnormal thyroid exam  - TSH   2. Fatigue  - Magnesium - TSH - Iron, Total/Total Iron Binding Cap - Vitamin B12   3. Vitamin D deficiency  - VITAMIN D 25 Hydroxy           Disposition pending results of labs.  I discussed the assessment and treatment plan with the patient. The patient was provided an opportunity to ask questions and all were answered. The patient agreed with the plan and demonstrated an understanding of the instructions.  I provided over 25 minutes of exam, counseling, chart review and  complex critical decision making.        The patient was advised to call back or seek an in-person evaluation if the symptoms change .   Kirtland Bouchard, MD

## 2022-07-25 LAB — VITAMIN B12: Vitamin B-12: 561 pg/mL (ref 200–1100)

## 2022-07-25 LAB — VITAMIN D 25 HYDROXY (VIT D DEFICIENCY, FRACTURES): Vit D, 25-Hydroxy: 31 ng/mL (ref 30–100)

## 2022-07-25 LAB — IRON, TOTAL/TOTAL IRON BINDING CAP
%SAT: 32 % (calc) (ref 16–45)
Iron: 112 ug/dL (ref 40–190)
TIBC: 353 mcg/dL (calc) (ref 250–450)

## 2022-07-25 LAB — MAGNESIUM: Magnesium: 2.2 mg/dL (ref 1.5–2.5)

## 2022-07-25 LAB — TSH: TSH: 1.27 mIU/L

## 2022-07-25 NOTE — Progress Notes (Signed)
<><><><><><><><><><><><><><><><><><><><><><><><><><><><><><><><><> <><><><><><><><><><><><><><><><><><><><><><><><><><><><><><><><><> -   Test results slightly outside the reference range are not unusual. If there is anything important, I will review this with you,  otherwise it is considered normal test values.  If you have further questions,  please do not hesitate to contact me at the office or via My Chart.  <><><><><><><><><><><><><><><><><><><><><><><><><><><><><><><><><> <><><><><><><><><><><><><><><><><><><><><><><><><><><><><><><><><>  -   Magnesium, Iron & Vit  B12 - All Normal  / OK  <><><><><><><><><><><><><><><><><><><><><><><><><><><><><><><><><>  -  Vit D = 32 - >   "ouch"  - very Low   - Vitamin D goal is between 70-100.   - Please Restart  Vitamin D 5,000 units /day    - It is very important as a natural anti-inflammatory and helping the  immune system protect against viral infections, like the Covid-19   - helping hair, skin, and nails, as well as reducing stroke and heart attack risk.   - It helps your bones and helps with mood.  - It also decreases numerous cancer risks so please                                                                                take it as directed.   - Low Vit D is associated with a 200-300% higher risk for CANCER   and 200-300% higher risk for HEART   ATTACK  &  STROKE.    - It is also associated with higher death rate at younger ages,   autoimmune diseases like Rheumatoid arthritis, Lupus, Multiple Sclerosis.     - Also many other serious conditions, like depression,                        Alzheimer's Dementia, infertility,                                                     muscle aches, fatigue,                                                                               fibromyalgia  <><><><><><><><><><><><><><><><><><><><><><><><><><><><><><><><><>  -  TSH is Normal   - So Thyroid uptake  & scan not indicated.\  -  Can do Thyroid U/S to assess for thyromegaly if you desire - Please let me know   <><><><><><><><><><><><><><><><><><><><><><><><><><><><><><><><><><>   -

## 2022-07-31 ENCOUNTER — Encounter: Payer: Self-pay | Admitting: Internal Medicine

## 2022-08-15 ENCOUNTER — Ambulatory Visit: Payer: 59

## 2022-08-15 DIAGNOSIS — Z1152 Encounter for screening for COVID-19: Secondary | ICD-10-CM

## 2022-08-15 DIAGNOSIS — R6889 Other general symptoms and signs: Secondary | ICD-10-CM

## 2022-08-15 LAB — POC COVID19 BINAXNOW: SARS Coronavirus 2 Ag: NEGATIVE

## 2022-08-15 LAB — POCT INFLUENZA A/B
Influenza A, POC: NEGATIVE
Influenza B, POC: NEGATIVE

## 2022-08-15 NOTE — Progress Notes (Signed)
Patient presents to the office for a nurse visit to have testing done for both Covid and Flu. States that her symptoms began on Tuesday evening.

## 2022-10-07 ENCOUNTER — Other Ambulatory Visit: Payer: Self-pay | Admitting: Internal Medicine

## 2022-10-07 MED ORDER — AZITHROMYCIN 250 MG PO TABS: ORAL_TABLET | ORAL | 1 refills | Status: DC

## 2023-01-26 ENCOUNTER — Other Ambulatory Visit: Payer: Self-pay | Admitting: Internal Medicine

## 2023-01-26 MED ORDER — AZITHROMYCIN 250 MG PO TABS: ORAL_TABLET | ORAL | 3 refills | Status: DC

## 2023-03-19 NOTE — Progress Notes (Unsigned)
Complete Physical  Assessment and Plan: Pamela Walton was seen today for annual exam.  Diagnoses and all orders for this visit:  Encounter for general adult medical examination with abnormal findings  Mammogram will be scheduled this year  Insulin resistance Continue diet and exercise -CMP  Vitamin D deficiency Continue Vit D supplementation to maintain value in therapeutic level of 60-100  -     VITAMIN D 25 Hydroxy (Vit-D Deficiency, Fractures)  Screening, lipid -     Lipid panel  Screening for hematuria or proteinuria -     Urinalysis, Routine w reflex microscopic  Screening for thyroid disorder -     TSH  Medication management -     CBC with Differential/Platelet -     COMPLETE METABOLIC PANEL WITH GFR -     Magnesium -     Lipid panel -     TSH -     VITAMIN D 25 Hydroxy (Vit-D Deficiency, Fractures) -     Urinalysis, Routine w reflex microscopic -     Microalbumin / creatinine urine ratio  Screening, anemia, deficiency, iron -     CBC with Differential/Platelet  Screening for metabolic disorder -     COMPLETE METABOLIC PANEL WITH GFR     Discussed med's effects and SE's. Screening labs and tests as requested with regular follow-up as recommended. Over 40 minutes of exam, counseling, chart review, and complex, high level critical decision making was performed this visit.   HPI  42 y.o. female  presents for a complete physical and follow up for has Vitamin D deficiency; Insulin resistance; Screening, lipid; Fatigue; Encounter for planned induction of labor; SVD 5/25; Postpartum care following vaginal delivery; and Obstetrical laceration - 2nd deg perineal, R labial splay on their problem list..  She has a 60 year old daughter who is going to kindergarten this year. She works as a PA-C   Her blood pressure has been controlled at home, today their BP is BP: 118/76  BP Readings from Last 3 Encounters:  03/20/23 118/76  07/24/22 120/70  01/15/19 120/72  She does  workout. She denies chest pain, shortness of breath, dizziness.   BMI is Body mass index is 24.9 kg/m., she has been working on diet and exercise. She does resistance training several days a week.  Wt Readings from Last 3 Encounters:  03/20/23 156 lb 9.6 oz (71 kg)  07/24/22 164 lb (74.4 kg)  01/10/18 194 lb 6.4 oz (88.2 kg)   She is not on cholesterol medication and denies myalgias. Her cholesterol is at goal. The cholesterol last visit was:   Lab Results  Component Value Date   CHOL 165 05/01/2018   HDL 50 (L) 05/01/2018   LDLCALC 89 05/01/2018   TRIG 164 (H) 05/01/2018   CHOLHDL 3.3 05/01/2018     Last A1C in the office was:  Lab Results  Component Value Date   HGBA1C 5.3 12/11/2018    Last GFR: Lab Results  Component Value Date   GFRNONAA 101 03/16/2020    Patient is on Vitamin D supplement.   Lab Results  Component Value Date   VD25OH 31 07/24/2022      Current Medications:  Current Outpatient Medications on File Prior to Visit  Medication Sig Dispense Refill   acetaminophen (TYLENOL) 325 MG tablet Take 2 tablets (650 mg total) by mouth every 4 (four) hours as needed (for pain scale < 4).     cetirizine (ZYRTEC) 10 MG tablet Take by mouth.  hyoscyamine (LEVSIN) 0.125 MG tablet Take by mouth every 4 (four) hours as needed.     Melatonin 10 MG TABS Take by mouth.     ZOFRAN 8 MG tablet Take  1/2 to 1 tablet  TID as needed for  Nausea or Vomiting. 50 tablet 1   azithromycin (ZITHROMAX) 250 MG tablet Take 2 tablets with Food on  Day 1, then 1 tablet Daily with Food for Sinusitis / Bronchitis (Patient not taking: Reported on 03/20/2023) 6 each 3   No current facility-administered medications on file prior to visit.   Allergies:  No Known Allergies Medical History:  She has Vitamin D deficiency; Insulin resistance; Screening, lipid; Fatigue; Encounter for planned induction of labor; SVD 5/25; Postpartum care following vaginal delivery; and Obstetrical laceration -  2nd deg perineal, R labial splay on their problem list. Health Maintenance:   Immunization History  Administered Date(s) Administered   Influenza Inj Mdck Quad With Preservative 06/10/2017   Influenza-Unspecified 05/12/2018, 06/02/2019   PFIZER(Purple Top)SARS-COV-2 Vaccination 09/04/2019, 09/25/2019, 07/31/2020   Tdap 03/26/2017, 10/31/2017     Last Dental Exam: Pamela Walton 01/2023 Last Eye Exam: needs to schedule Patient Care Team: Lucky Cowboy, MD as PCP - General (Internal Medicine) Judd Gaudier, NP as Nurse Practitioner (Nurse Practitioner)  Surgical History:  She has a past surgical history that includes No past surgeries. Family History:  Herfamily history includes Breast cancer in her maternal aunt and maternal aunt; Breast cancer (age of onset: 89) in her mother; Cancer in her father and mother; Diabetes in her mother; Heart attack in her father. Social History:  She reports that she has never smoked. She has never used smokeless tobacco. She reports that she does not currently use alcohol. She reports that she does not use drugs.  Review of Systems: Review of Systems  Constitutional:  Negative for chills and fever.  HENT:  Negative for congestion, hearing loss, sinus pain, sore throat and tinnitus.   Eyes:  Negative for blurred vision and double vision.  Respiratory:  Negative for cough, hemoptysis, sputum production, shortness of breath and wheezing.   Cardiovascular:  Negative for chest pain, palpitations and leg swelling.  Gastrointestinal:  Negative for abdominal pain, constipation, diarrhea, heartburn, nausea and vomiting.  Genitourinary:  Negative for dysuria and urgency.  Musculoskeletal:  Negative for back pain, falls, joint pain, myalgias and neck pain.  Skin:  Negative for rash.  Neurological:  Negative for dizziness, tingling, tremors, weakness and headaches.  Endo/Heme/Allergies:  Does not bruise/bleed easily.  Psychiatric/Behavioral:  Negative for  depression and suicidal ideas. The patient is not nervous/anxious and does not have insomnia.     Physical Exam: Estimated body mass index is 24.9 kg/m as calculated from the following:   Height as of this encounter: 5' 6.5" (1.689 m).   Weight as of this encounter: 156 lb 9.6 oz (71 kg). BP 118/76   Pulse (!) 101   Temp (!) 97.5 F (36.4 C)   Ht 5' 6.5" (1.689 m)   Wt 156 lb 9.6 oz (71 kg)   SpO2 98%   BMI 24.90 kg/m  General Appearance: Well nourished, in no apparent distress.  Eyes: PERRLA, EOMs, conjunctiva no swelling or erythema  Sinuses: No Frontal/maxillary tenderness  ENT/Mouth: Ext aud canals clear, normal light reflex with TMs without erythema, bulging. Good dentition. No erythema, swelling, or exudate on post pharynx.  Hearing normal.  Neck: Supple, thyroid normal. No bruits  Respiratory: Respiratory effort normal, BS equal bilaterally without rales, rhonchi, wheezing  or stridor.  Cardio: RRR without murmurs, rubs or gallops. Brisk peripheral pulses without edema.  Chest: symmetric, with normal excursions and percussion.  Breasts: Defer to GYN Abdomen: Positive bowel sounds all 4 quadrants, Soft, nontender, no guarding, rebound, hernias, masses, or organomegaly.  Lymphatics: Non tender without lymphadenopathy.  Genitourinary: defer to GYN Musculoskeletal: Full ROM all peripheral extremities,5/5 strength, and normal gait.  Skin: Warm, dry without rashes, lesions, ecchymosis. Neuro: Cranial nerves intact.Normal muscle tone, no cerebellar symptoms. Sensation intact.  Psych: Awake and oriented X 3, normal affect, Insight and Judgment appropriate.   EKG: Defer  Caren Garske E Aundria Rud 2:12 PM [facility]

## 2023-03-20 ENCOUNTER — Encounter: Payer: Self-pay | Admitting: Nurse Practitioner

## 2023-03-20 ENCOUNTER — Ambulatory Visit (INDEPENDENT_AMBULATORY_CARE_PROVIDER_SITE_OTHER): Payer: 59 | Admitting: Nurse Practitioner

## 2023-03-20 VITALS — BP 118/76 | HR 101 | Temp 97.5°F | Ht 66.5 in | Wt 156.6 lb

## 2023-03-20 DIAGNOSIS — Z1329 Encounter for screening for other suspected endocrine disorder: Secondary | ICD-10-CM

## 2023-03-20 DIAGNOSIS — Z13228 Encounter for screening for other metabolic disorders: Secondary | ICD-10-CM

## 2023-03-20 DIAGNOSIS — Z0000 Encounter for general adult medical examination without abnormal findings: Secondary | ICD-10-CM

## 2023-03-20 DIAGNOSIS — Z13220 Encounter for screening for lipoid disorders: Secondary | ICD-10-CM | POA: Diagnosis not present

## 2023-03-20 DIAGNOSIS — Z1389 Encounter for screening for other disorder: Secondary | ICD-10-CM | POA: Diagnosis not present

## 2023-03-20 DIAGNOSIS — Z79899 Other long term (current) drug therapy: Secondary | ICD-10-CM | POA: Diagnosis not present

## 2023-03-20 DIAGNOSIS — Z130 Encounter for screening for diseases of the blood and blood-forming organs and certain disorders involving the immune mechanism: Secondary | ICD-10-CM

## 2023-03-20 DIAGNOSIS — E88819 Insulin resistance, unspecified: Secondary | ICD-10-CM

## 2023-03-20 DIAGNOSIS — Z0001 Encounter for general adult medical examination with abnormal findings: Secondary | ICD-10-CM

## 2023-03-20 DIAGNOSIS — E559 Vitamin D deficiency, unspecified: Secondary | ICD-10-CM | POA: Diagnosis not present

## 2023-03-20 NOTE — Patient Instructions (Signed)

## 2023-03-21 LAB — CBC WITH DIFFERENTIAL/PLATELET
Absolute Monocytes: 536 cells/uL (ref 200–950)
Basophils Absolute: 38 cells/uL (ref 0–200)
Basophils Relative: 0.4 %
Eosinophils Absolute: 56 cells/uL (ref 15–500)
Eosinophils Relative: 0.6 %
HCT: 41.7 % (ref 35.0–45.0)
Hemoglobin: 14.1 g/dL (ref 11.7–15.5)
Lymphs Abs: 2143 cells/uL (ref 850–3900)
MCH: 29.6 pg (ref 27.0–33.0)
MCHC: 33.8 g/dL (ref 32.0–36.0)
MCV: 87.6 fL (ref 80.0–100.0)
MPV: 10.7 fL (ref 7.5–12.5)
Monocytes Relative: 5.7 %
Neutro Abs: 6627 cells/uL (ref 1500–7800)
Neutrophils Relative %: 70.5 %
Platelets: 322 10*3/uL (ref 140–400)
RBC: 4.76 10*6/uL (ref 3.80–5.10)
RDW: 12.3 % (ref 11.0–15.0)
Total Lymphocyte: 22.8 %
WBC: 9.4 10*3/uL (ref 3.8–10.8)

## 2023-03-21 LAB — COMPLETE METABOLIC PANEL WITH GFR
AG Ratio: 2.1 (calc) (ref 1.0–2.5)
ALT: 20 U/L (ref 6–29)
AST: 22 U/L (ref 10–30)
Albumin: 4.9 g/dL (ref 3.6–5.1)
Alkaline phosphatase (APISO): 36 U/L (ref 31–125)
BUN: 16 mg/dL (ref 7–25)
CO2: 28 mmol/L (ref 20–32)
Calcium: 9.6 mg/dL (ref 8.6–10.2)
Chloride: 102 mmol/L (ref 98–110)
Creat: 0.71 mg/dL (ref 0.50–0.97)
Globulin: 2.3 g/dL (calc) (ref 1.9–3.7)
Glucose, Bld: 93 mg/dL (ref 65–99)
Potassium: 4.1 mmol/L (ref 3.5–5.3)
Sodium: 138 mmol/L (ref 135–146)
Total Bilirubin: 0.3 mg/dL (ref 0.2–1.2)
Total Protein: 7.2 g/dL (ref 6.1–8.1)
eGFR: 111 mL/min/{1.73_m2} (ref >=60)

## 2023-03-21 LAB — LIPID PANEL
Cholesterol: 181 mg/dL (ref <200)
HDL: 48 mg/dL — ABNORMAL LOW (ref >=50)
LDL Cholesterol (Calc): 107 mg/dL (calc) — ABNORMAL HIGH
Non-HDL Cholesterol (Calc): 133 mg/dL (calc) — ABNORMAL HIGH (ref <130)
Total CHOL/HDL Ratio: 3.8 (calc) (ref <5.0)
Triglycerides: 151 mg/dL — ABNORMAL HIGH (ref <150)

## 2023-03-21 LAB — MAGNESIUM: Magnesium: 2.1 mg/dL (ref 1.5–2.5)

## 2023-05-10 ENCOUNTER — Other Ambulatory Visit: Payer: No Typology Code available for payment source

## 2023-06-26 ENCOUNTER — Other Ambulatory Visit: Payer: Self-pay | Admitting: Adult Health

## 2023-06-26 DIAGNOSIS — Z1231 Encounter for screening mammogram for malignant neoplasm of breast: Secondary | ICD-10-CM

## 2023-07-11 DIAGNOSIS — H5203 Hypermetropia, bilateral: Secondary | ICD-10-CM | POA: Diagnosis not present

## 2023-07-16 ENCOUNTER — Ambulatory Visit
Admission: RE | Admit: 2023-07-16 | Discharge: 2023-07-16 | Disposition: A | Discharge status: Home / Self Care | Payer: 59 | Source: Ambulatory Visit | Attending: Adult Health

## 2023-07-16 DIAGNOSIS — Z1231 Encounter for screening mammogram for malignant neoplasm of breast: Secondary | ICD-10-CM

## 2024-03-19 ENCOUNTER — Encounter: Payer: 59 | Admitting: Nurse Practitioner

## 2024-04-01 ENCOUNTER — Telehealth: Payer: Self-pay | Admitting: Gastroenterology

## 2024-04-01 ENCOUNTER — Other Ambulatory Visit: Payer: Self-pay | Admitting: *Deleted

## 2024-04-01 DIAGNOSIS — R1013 Epigastric pain: Secondary | ICD-10-CM

## 2024-04-01 DIAGNOSIS — K219 Gastro-esophageal reflux disease without esophagitis: Secondary | ICD-10-CM

## 2024-04-01 NOTE — Telephone Encounter (Signed)
 Discussed with patient.  She has concern for possible H. 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  Has longstanding upper GI symptoms and has been on OTC Prilosec/omeprazole  for years.  No previous endoscopy.  Reports history of borderline low gallbladder ejection fraction in college/PA school.  Requesting/had discussion in regards to [employer] H. pylori test.  Will proceed and order that.

## 2024-05-19 DIAGNOSIS — R87810 Cervical high risk human papillomavirus (HPV) DNA test positive: Secondary | ICD-10-CM | POA: Diagnosis not present

## 2024-05-19 DIAGNOSIS — Z01419 Encounter for gynecological examination (general) (routine) without abnormal findings: Secondary | ICD-10-CM | POA: Diagnosis not present

## 2024-05-19 DIAGNOSIS — Z1331 Encounter for screening for depression: Secondary | ICD-10-CM | POA: Diagnosis not present

## 2024-06-04 DIAGNOSIS — S0502XA Injury of conjunctiva and corneal abrasion without foreign body, left eye, initial encounter: Secondary | ICD-10-CM | POA: Diagnosis not present

## 2024-08-27 ENCOUNTER — Telehealth: Payer: Self-pay

## 2024-08-27 NOTE — Telephone Encounter (Signed)
 Pt can not have orders placed until she is seen by the Dr. Marybelle can be placed at pts office visit   Copied from CRM #[id number]. Topic: Clinical - Request for Lab/Test Order >> Aug 27, 2024  3:07 PM Pamela Walton wrote: Reason for CRM: Patient needs order put in for screening mammogram. Please let her know when order is via MyChart portal

## 2024-10-15 ENCOUNTER — Ambulatory Visit: Admitting: Sports Medicine
